# Patient Record
Sex: Female | Born: 1965
Health system: Southern US, Community
[De-identification: ages and names within clinical notes are randomized; demographics above are authoritative.]

## PROBLEM LIST (undated history)

## (undated) DIAGNOSIS — E119 Type 2 diabetes mellitus without complications: Secondary | ICD-10-CM

## (undated) DIAGNOSIS — F1010 Alcohol abuse, uncomplicated: Secondary | ICD-10-CM

## (undated) DIAGNOSIS — Z1509 Genetic susceptibility to other malignant neoplasm: Secondary | ICD-10-CM

## (undated) DIAGNOSIS — C55 Malignant neoplasm of uterus, part unspecified: Secondary | ICD-10-CM

## (undated) DIAGNOSIS — K8020 Calculus of gallbladder without cholecystitis without obstruction: Secondary | ICD-10-CM

## (undated) DIAGNOSIS — F419 Anxiety disorder, unspecified: Secondary | ICD-10-CM

## (undated) HISTORY — DX: Malignant neoplasm of uterus, part unspecified: C55

## (undated) HISTORY — PX: OTHER SURGICAL HISTORY: SHX169

## (undated) HISTORY — DX: Genetic susceptibility to other malignant neoplasm: Z15.09

## (undated) HISTORY — DX: Anxiety disorder, unspecified: F41.9

---

## 1994-10-29 HISTORY — PX: ABDOMINAL EXPLORATION SURGERY: SHX538

## 2011-10-14 ENCOUNTER — Ambulatory Visit: Payer: Self-pay

## 2011-10-14 DIAGNOSIS — M542 Cervicalgia: Secondary | ICD-10-CM

## 2011-11-13 ENCOUNTER — Ambulatory Visit: Payer: Self-pay | Admitting: Internal Medicine

## 2011-11-13 ENCOUNTER — Encounter: Payer: Self-pay | Admitting: Internal Medicine

## 2011-11-13 ENCOUNTER — Ambulatory Visit (INDEPENDENT_AMBULATORY_CARE_PROVIDER_SITE_OTHER): Payer: Self-pay | Admitting: Internal Medicine

## 2011-11-13 DIAGNOSIS — R739 Hyperglycemia, unspecified: Secondary | ICD-10-CM

## 2011-11-13 DIAGNOSIS — R7309 Other abnormal glucose: Secondary | ICD-10-CM

## 2011-11-13 DIAGNOSIS — E119 Type 2 diabetes mellitus without complications: Secondary | ICD-10-CM | POA: Insufficient documentation

## 2011-11-13 MED ORDER — GLUCOSE BLOOD VI STRP: ORAL_STRIP | Status: DC

## 2011-11-13 MED ORDER — FREESTYLE LANCETS MISC: Status: DC

## 2011-11-13 NOTE — Assessment & Plan Note (Addendum)
Dx today w/ diabetes base on a non fasting CBG of 460 (this afternoon CBG is 338). Discussed the dx, treatment, risks of DM Plan: Needs a A1C, CMP, CBC, TSH, FLP, some of the labs  apparently done at gyn Start meds w/results Diet-exercise discussed , info provided  Glucometer provided, to check twice a day RTC 1 month Addendum, Labs from 11/12/2011:  CBC normal except for platelets of 100. Creatinine, LFTs normal. Blood sugar 463, total cholesterol 233, triglycerides 119, LDL 119. TSH normal. Hemoglobin A1c 11.6 Plan: Amaryl 2 mg daily, metformin 500 mg 3 times a day, nutrition referral, return to the office in one month as planned. Will discuss low blood sugar symptoms Check blood sugars twice a day, readings in 2 weeks, call if less than 100 or more than 250. Recheck a CBC on return to the office

## 2011-11-13 NOTE — Progress Notes (Signed)
  Subjective:    Patient ID: Olivia Sweeney, female    DOB: [birth year], 46 y.o.   MRN: [id number]  HPI New patient, seen acutely b/c CGB was 460 at her gynecologist, Dr Dareen Piano. Pt feels well in general, has occasional dizziness x long time, better after she eats. She recalls years ago that she had hyperglycemia but a GTT was negative, has not seen a MD in years until this morning when she saw her gynecologist  Past medical history H/o obesity, up to 230 pounds x several years until ~ 2000 AODM dx 10-2011   Past surgical history No major surgeries  Social history divorced, no children Tobacco--no ETOH-- socially  Occupation-- works at [employer] Diet-- quite unhealthy per pt  Exercise-- active at work, no routine exercise    Family history Diabetes-- GM CAD-- no Stroke--no Colon cancer--no Breast cancer-- M Skin cancer-- F Uterine ca-- M Kidney ca-- M   Review of Systems No N-V, no abd pain No dysuria or grosss hematuria No CP-SOB No increase thirst or urination WT stable over the last few years although she was quite obese in the 2000s and lost wt by stop eating "junk"    Objective:   Physical Exam  Constitutional: She is oriented to person, place, and time. She appears well-developed and well-nourished. No distress.  HENT:  Head: Normocephalic and atraumatic.  Neck: No thyromegaly present.  Cardiovascular: Normal rate, regular rhythm and normal heart sounds.   No murmur heard. Pulmonary/Chest: Effort normal and breath sounds normal. No respiratory distress. She has no wheezes. She has no rales.  Abdominal: Soft. Bowel sounds are normal. She exhibits no distension. There is no rebound and no guarding.  Musculoskeletal: She exhibits no edema.  Neurological: She is alert and oriented to person, place, and time.  Skin: She is not diaphoretic.       Rosacea changes in the face   Psychiatric: She has a normal mood and affect. Her behavior is normal. Judgment  and thought content normal.       Assessment & Plan:

## 2011-11-14 ENCOUNTER — Telehealth: Payer: Self-pay | Admitting: Internal Medicine

## 2011-11-14 MED ORDER — METFORMIN HCL 500 MG PO TABS: 500.0000 mg | ORAL_TABLET | Freq: Three times a day (TID) | ORAL | Status: DC

## 2011-11-14 MED ORDER — GLIMEPIRIDE 2 MG PO TABS: 2.0000 mg | ORAL_TABLET | Freq: Every day | ORAL | Status: DC

## 2011-11-14 NOTE — Telephone Encounter (Signed)
Pt is aware and rx have been sent.  Called pharmacy and all pt's rx have been received.  Called to make pt aware.

## 2011-11-14 NOTE — Telephone Encounter (Signed)
Advised pt on low blood sugar signs and symptoms as well.

## 2011-11-14 NOTE — Telephone Encounter (Signed)
Advised patient, I reviewed the labs from her gynecologist, diabetes is confirmed. Plan: nutritionisrt referral (entered) Glimepiride 2 mg daily metformin 500 mg take twice a day with meals for one week, then take it 3 times a day. Side effects may include nausea and diarrhea Please discuss w/ pt low blood sugar symptoms Check blood sugars twice a day, call with readings in 2 weeks call if CBGs  less than 100 or more than 250. F/U 1 month as planned

## 2011-11-14 NOTE — Telephone Encounter (Signed)
Left a message for pt to return call 

## 2011-11-18 ENCOUNTER — Encounter: Payer: Self-pay | Admitting: Internal Medicine

## 2011-11-20 ENCOUNTER — Other Ambulatory Visit: Payer: Self-pay | Admitting: Obstetrics and Gynecology

## 2011-11-20 ENCOUNTER — Telehealth: Payer: Self-pay | Admitting: Internal Medicine

## 2011-11-20 DIAGNOSIS — R928 Other abnormal and inconclusive findings on diagnostic imaging of breast: Secondary | ICD-10-CM

## 2011-11-20 NOTE — Telephone Encounter (Signed)
That's quite unusual, if she's not taking any medications I would suspect her blood sugars to be still high. Ask patient to come back tomorrow with her glucometer, check her sugar with her glucometer and our glucometer. Hold medications for now.

## 2011-11-20 NOTE — Telephone Encounter (Signed)
Patient states that she was seen last week because her sugar level was high 476?Marland Kitchen Patient states that she checked her sugar this morning and it was 76. Patient states that she has yet to start taking the medication that was prescribed to her last week. She wants to know if she should start taking medicine since her sugar levels have dropped? Patient is unsure of the medication name that was given to her.

## 2011-11-20 NOTE — Telephone Encounter (Signed)
Discuss with patient will come in tomorrow with glucometer.

## 2011-11-21 ENCOUNTER — Ambulatory Visit (INDEPENDENT_AMBULATORY_CARE_PROVIDER_SITE_OTHER): Payer: Self-pay | Admitting: Internal Medicine

## 2011-11-21 DIAGNOSIS — E119 Type 2 diabetes mellitus without complications: Secondary | ICD-10-CM

## 2011-11-21 NOTE — Patient Instructions (Addendum)
Return to office in 1 month to see Dr Drue Novel  No medication needed at this time  Check blood sugars twice a day( fasting, 2 hours after meal rotate times) if reading >200 call office immediately. Call in 2 weeks with blood sugar readings.

## 2011-11-22 NOTE — Assessment & Plan Note (Signed)
Patient was asked to come here to check her glucometer after she reported CBG of 76 at home without taking any medications. Her blood sugar today is ~130, similar results with her glucometer. With this reading, we don't need to start medications. Unclear why her blood sugars was recently in the 400s and her A1c was elevated; labs error?. See Instructions

## 2011-11-22 NOTE — Progress Notes (Signed)
  Subjective:    Patient ID: Olivia Sweeney, female    DOB: 24-Dec-1965, 46 y.o.   MRN: [id number]  HPI Here to compare her glucometer  Review of Systems     Objective:   Physical Exam        Assessment & Plan:

## 2011-11-27 ENCOUNTER — Other Ambulatory Visit: Payer: Self-pay | Admitting: Obstetrics and Gynecology

## 2011-11-27 ENCOUNTER — Ambulatory Visit
Admission: RE | Admit: 2011-11-27 | Discharge: 2011-11-27 | Disposition: A | Discharge status: Home / Self Care | Payer: Self-pay | Source: Ambulatory Visit | Attending: Obstetrics and Gynecology | Admitting: Obstetrics and Gynecology

## 2011-11-27 DIAGNOSIS — R928 Other abnormal and inconclusive findings on diagnostic imaging of breast: Secondary | ICD-10-CM

## 2011-11-28 ENCOUNTER — Ambulatory Visit: Payer: Self-pay | Admitting: Internal Medicine

## 2011-12-23 ENCOUNTER — Telehealth: Payer: Self-pay | Admitting: Internal Medicine

## 2011-12-23 NOTE — Telephone Encounter (Signed)
Due for a OV, ref blood sugars

## 2011-12-24 NOTE — Telephone Encounter (Signed)
LMOVM for pt to return call to set up OV.  

## 2011-12-31 ENCOUNTER — Encounter: Payer: 59 | Attending: Internal Medicine

## 2012-01-01 ENCOUNTER — Ambulatory Visit (INDEPENDENT_AMBULATORY_CARE_PROVIDER_SITE_OTHER): Payer: 59 | Admitting: Internal Medicine

## 2012-01-01 DIAGNOSIS — E119 Type 2 diabetes mellitus without complications: Secondary | ICD-10-CM

## 2012-01-01 DIAGNOSIS — M25519 Pain in unspecified shoulder: Secondary | ICD-10-CM

## 2012-01-01 LAB — BASIC METABOLIC PANEL WITH GFR
CO2: 23 mEq/L (ref 19–32)
Glucose, Bld: 131 mg/dL — ABNORMAL HIGH (ref 70–99)
Potassium: 4.1 mEq/L (ref 3.5–5.3)
Sodium: 138 mEq/L (ref 135–145)

## 2012-01-01 NOTE — Assessment & Plan Note (Addendum)
Here for followup on diabetes. See previous entries. Today she reports that in the last few months has gotten 3 steroid injections as well as prednisone by mouth; I am still somehow puzzled by the fact that the A1c is very high and her blood sugars are not that elevated. She is losing weight, she is slightly tachycardic Plan: A1c, TSH. Depending on results she may need to be referred to endocrinology.

## 2012-01-01 NOTE — Patient Instructions (Signed)
Will call with results

## 2012-01-01 NOTE — Progress Notes (Signed)
  Subjective:    Patient ID: Olivia Sweeney, female    DOB: October 04, 1966, 46 y.o.   MRN: [id number]  HPI F/U from previous visit. She had a very high A1c at gynecology, I diagnosed her with diabetes, she however started to check her CBGs and they were essentially okay so she's currently not taking any medication for diabetes. Today, she also reports left shoulder pain with radiation to the left arm. Has been seen elsewhere, has been prescribed a steroid shot and steroid tablets on 05-2011, December 2012 and finally  12/11/2013.   Past medical history  H/o obesity, up to 230 pounds x several years until ~ 2000  AODM dx 10-2011  Past surgical history  No major surgeries   Review of Systems Ambulatory blood sugars are around 114 in the morning, however after a day and steroid injection 12-12-11 her CBGs went up temporarily. I notice some weight loss, she denies any nausea, vomiting, diarrhea. No abdominal pain. She does not feel unusually thirsty.     Objective:   Physical Exam Alert, oriented x3. Underwent appearing but otherwise in no distress. Lungs clear to auscultation bilaterally Cardiovascular, neuro murmur, heart rate about 100. Extremities no edema       Assessment & Plan:

## 2012-01-02 ENCOUNTER — Encounter: Payer: Self-pay | Admitting: Internal Medicine

## 2012-01-02 NOTE — Assessment & Plan Note (Signed)
See HPI, refer to ortho

## 2012-01-03 ENCOUNTER — Other Ambulatory Visit: Payer: Self-pay | Admitting: *Deleted

## 2012-01-03 MED ORDER — METFORMIN HCL 500 MG PO TABS: 500.0000 mg | ORAL_TABLET | Freq: Two times a day (BID) | ORAL | Status: DC

## 2012-02-19 ENCOUNTER — Encounter (HOSPITAL_COMMUNITY): Payer: Self-pay | Admitting: Pharmacy Technician

## 2012-02-19 ENCOUNTER — Other Ambulatory Visit: Payer: Self-pay | Admitting: Orthopedic Surgery

## 2012-02-28 ENCOUNTER — Encounter (HOSPITAL_COMMUNITY)
Admission: RE | Admit: 2012-02-28 | Discharge: 2012-02-28 | Disposition: A | Discharge status: Home / Self Care | Payer: 59 | Source: Ambulatory Visit | Attending: Orthopedic Surgery | Admitting: Orthopedic Surgery

## 2012-02-28 ENCOUNTER — Encounter (HOSPITAL_COMMUNITY): Payer: Self-pay

## 2012-02-28 LAB — COMPREHENSIVE METABOLIC PANEL
AST: 29 U/L (ref 0–37)
Albumin: 4 g/dL (ref 3.5–5.2)
Alkaline Phosphatase: 49 U/L (ref 39–117)
BUN: 16 mg/dL (ref 6–23)
Chloride: 100 mEq/L (ref 96–112)
Potassium: 3.7 mEq/L (ref 3.5–5.1)
Total Bilirubin: 0.3 mg/dL (ref 0.3–1.2)
Total Protein: 7.3 g/dL (ref 6.0–8.3)

## 2012-02-28 LAB — CBC
HCT: 39.4 % (ref 36.0–46.0)
MCHC: 34.5 g/dL (ref 30.0–36.0)
Platelets: 167 10*3/uL (ref 150–400)
RDW: 12.8 % (ref 11.5–15.5)
WBC: 3.6 10*3/uL — ABNORMAL LOW (ref 4.0–10.5)

## 2012-02-28 LAB — DIFFERENTIAL
Eosinophils Absolute: 0.1 10*3/uL (ref 0.0–0.7)
Lymphs Abs: 0.9 10*3/uL (ref 0.7–4.0)
Monocytes Absolute: 0.5 10*3/uL (ref 0.1–1.0)
Monocytes Relative: 15 % — ABNORMAL HIGH (ref 3–12)
Neutro Abs: 2 10*3/uL (ref 1.7–7.7)
Neutrophils Relative %: 55 % (ref 43–77)

## 2012-02-28 LAB — PROTIME-INR: INR: 0.88 (ref 0.00–1.49)

## 2012-02-28 LAB — TYPE AND SCREEN: ABO/RH(D): A POS

## 2012-02-28 LAB — SURGICAL PCR SCREEN
MRSA, PCR: NEGATIVE
Staphylococcus aureus: NEGATIVE

## 2012-02-28 LAB — HCG, SERUM, QUALITATIVE: Preg, Serum: NEGATIVE

## 2012-02-28 LAB — APTT: aPTT: 27 seconds (ref 24–37)

## 2012-02-28 NOTE — Pre-Procedure Instructions (Addendum)
20 NOHELANI BENNING  02/28/2012   Your procedure is scheduled on:  Thursday May 9  Report to [facility] at 9:00 AM.  Call this number if you have problems the morning of surgery: [phone]   Remember:   Do not eat food:After Midnight.  May have clear liquids: up to 4 Hours before arrival.  Clear liquids include soda, tea, black coffee, apple or grape juice, broth.  Take these medicines the morning of surgery with A SIP OF WATER: none   Do not wear jewelry, make-up or nail polish.  Do not wear lotions, powders, or perfumes. You may wear deodorant.  Do not shave 48 hours prior to surgery.  Do not bring valuables to the hospital.  Contacts, dentures or bridgework may not be worn into surgery.  Leave suitcase in the car. After surgery it may be brought to your room.  For patients admitted to the hospital, checkout time is 11:00 AM the day of discharge.   Patients discharged the day of surgery will not be allowed to drive home.  Name and phone number of your driver: NA  Special Instructions: Incentive Spirometry - Practice and bring it with you on the day of surgery. and CHG Shower Use Special Wash: 1/2 bottle night before surgery and 1/2 bottle morning of surgery.   Please read over the following fact sheets that you were given: Pain Booklet, Coughing and Deep Breathing, Blood Transfusion Information and Surgical Site Infection Prevention

## 2012-03-05 MED ORDER — CEFAZOLIN SODIUM-DEXTROSE 2-3 GM-% IV SOLR
2.0000 g | INTRAVENOUS | Status: AC
  Administered 2012-03-06: 2 g via INTRAVENOUS
  Filled 2012-03-05: qty 50

## 2012-03-06 ENCOUNTER — Encounter (HOSPITAL_COMMUNITY): Payer: Self-pay

## 2012-03-06 ENCOUNTER — Encounter (HOSPITAL_COMMUNITY): Payer: Self-pay | Admitting: Certified Registered Nurse Anesthetist

## 2012-03-06 ENCOUNTER — Encounter (HOSPITAL_COMMUNITY)
Admission: RE | Disposition: A | Discharge status: Home / Self Care | Payer: Self-pay | Source: Ambulatory Visit | Attending: Orthopedic Surgery

## 2012-03-06 ENCOUNTER — Ambulatory Visit (HOSPITAL_COMMUNITY): Payer: 59

## 2012-03-06 ENCOUNTER — Ambulatory Visit (HOSPITAL_COMMUNITY): Payer: 59 | Admitting: Certified Registered Nurse Anesthetist

## 2012-03-06 ENCOUNTER — Inpatient Hospital Stay (HOSPITAL_COMMUNITY)
Admission: RE | Admit: 2012-03-06 | Discharge: 2012-03-07 | DRG: 473 | Disposition: A | Discharge status: Home / Self Care | Payer: 59 | Source: Ambulatory Visit | Attending: Orthopedic Surgery | Admitting: Orthopedic Surgery

## 2012-03-06 DIAGNOSIS — Z01818 Encounter for other preprocedural examination: Secondary | ICD-10-CM

## 2012-03-06 DIAGNOSIS — Z01810 Encounter for preprocedural cardiovascular examination: Secondary | ICD-10-CM

## 2012-03-06 DIAGNOSIS — Z01812 Encounter for preprocedural laboratory examination: Secondary | ICD-10-CM

## 2012-03-06 DIAGNOSIS — M5030 Other cervical disc degeneration, unspecified cervical region: Principal | ICD-10-CM | POA: Diagnosis present

## 2012-03-06 DIAGNOSIS — M5020 Other cervical disc displacement, unspecified cervical region: Secondary | ICD-10-CM | POA: Diagnosis present

## 2012-03-06 DIAGNOSIS — E119 Type 2 diabetes mellitus without complications: Secondary | ICD-10-CM | POA: Diagnosis present

## 2012-03-06 DIAGNOSIS — M5412 Radiculopathy, cervical region: Secondary | ICD-10-CM

## 2012-03-06 DIAGNOSIS — Z01811 Encounter for preprocedural respiratory examination: Secondary | ICD-10-CM

## 2012-03-06 HISTORY — PX: ANTERIOR CERVICAL DECOMP/DISCECTOMY FUSION: SHX1161

## 2012-03-06 LAB — GLUCOSE, CAPILLARY: Glucose-Capillary: 104 mg/dL — ABNORMAL HIGH (ref 70–99)

## 2012-03-06 LAB — URINALYSIS, ROUTINE W REFLEX MICROSCOPIC
Glucose, UA: NEGATIVE mg/dL
Ketones, ur: NEGATIVE mg/dL
Protein, ur: 30 mg/dL — AB

## 2012-03-06 LAB — URINE MICROSCOPIC-ADD ON

## 2012-03-06 SURGERY — ANTERIOR CERVICAL DECOMPRESSION/DISCECTOMY FUSION 1 LEVEL
Anesthesia: General | Site: Spine Cervical | Laterality: Left | Wound class: Clean

## 2012-03-06 MED ORDER — HYDROMORPHONE HCL PF 1 MG/ML IJ SOLN
0.2500 mg | INTRAMUSCULAR | Status: DC | PRN
  Administered 2012-03-06: 0.5 mg via INTRAVENOUS

## 2012-03-06 MED ORDER — ACETAMINOPHEN 650 MG RE SUPP: 650.0000 mg | RECTAL | Status: DC | PRN

## 2012-03-06 MED ORDER — ONDANSETRON HCL 4 MG/2ML IJ SOLN: 4.0000 mg | INTRAMUSCULAR | Status: DC | PRN

## 2012-03-06 MED ORDER — DOCUSATE SODIUM 100 MG PO CAPS
100.0000 mg | ORAL_CAPSULE | Freq: Two times a day (BID) | ORAL | Status: DC
  Administered 2012-03-06: 100 mg via ORAL
  Filled 2012-03-06: qty 1

## 2012-03-06 MED ORDER — MENTHOL 3 MG MT LOZG
1.0000 | LOZENGE | OROMUCOSAL | Status: DC | PRN
  Administered 2012-03-06: 3 mg via ORAL
  Filled 2012-03-06: qty 9

## 2012-03-06 MED ORDER — BUPIVACAINE-EPINEPHRINE 0.25% -1:200000 IJ SOLN
INTRAMUSCULAR | Status: DC | PRN
  Administered 2012-03-06: 2 mL

## 2012-03-06 MED ORDER — SENNA 8.6 MG PO TABS
1.0000 | ORAL_TABLET | Freq: Two times a day (BID) | ORAL | Status: DC
  Filled 2012-03-06 (×3): qty 1

## 2012-03-06 MED ORDER — ROCURONIUM BROMIDE 100 MG/10ML IV SOLN
INTRAVENOUS | Status: DC | PRN
  Administered 2012-03-06: 50 mg via INTRAVENOUS

## 2012-03-06 MED ORDER — SODIUM CHLORIDE 0.9 % IJ SOLN: 3.0000 mL | INTRAMUSCULAR | Status: DC | PRN

## 2012-03-06 MED ORDER — ONDANSETRON HCL 4 MG/2ML IJ SOLN: 4.0000 mg | Freq: Once | INTRAMUSCULAR | Status: DC | PRN

## 2012-03-06 MED ORDER — DIAZEPAM 5 MG PO TABS
5.0000 mg | ORAL_TABLET | Freq: Four times a day (QID) | ORAL | Status: DC | PRN
  Administered 2012-03-06: 5 mg via ORAL
  Filled 2012-03-06: qty 1

## 2012-03-06 MED ORDER — ALUM & MAG HYDROXIDE-SIMETH 200-200-20 MG/5ML PO SUSP: 30.0000 mL | Freq: Four times a day (QID) | ORAL | Status: DC | PRN

## 2012-03-06 MED ORDER — DEXAMETHASONE SODIUM PHOSPHATE 4 MG/ML IJ SOLN
INTRAMUSCULAR | Status: DC | PRN
  Administered 2012-03-06: 4 mg via INTRAVENOUS

## 2012-03-06 MED ORDER — MORPHINE SULFATE 4 MG/ML IJ SOLN: 0.0500 mg/kg | INTRAMUSCULAR | Status: DC | PRN

## 2012-03-06 MED ORDER — POVIDONE-IODINE 7.5 % EX SOLN
Freq: Once | CUTANEOUS | Status: DC
  Filled 2012-03-06: qty 118

## 2012-03-06 MED ORDER — CEFAZOLIN SODIUM 1-5 GM-% IV SOLN
1.0000 g | Freq: Three times a day (TID) | INTRAVENOUS | Status: AC
  Administered 2012-03-06 – 2012-03-07 (×2): 1 g via INTRAVENOUS
  Filled 2012-03-06 (×2): qty 50

## 2012-03-06 MED ORDER — ZOLPIDEM TARTRATE 5 MG PO TABS: 5.0000 mg | ORAL_TABLET | Freq: Every evening | ORAL | Status: DC | PRN

## 2012-03-06 MED ORDER — LACTATED RINGERS IV SOLN
INTRAVENOUS | Status: DC | PRN
  Administered 2012-03-06 (×2): via INTRAVENOUS

## 2012-03-06 MED ORDER — FENTANYL CITRATE 0.05 MG/ML IJ SOLN
INTRAMUSCULAR | Status: DC | PRN
  Administered 2012-03-06: 25 ug via INTRAVENOUS
  Administered 2012-03-06: 50 ug via INTRAVENOUS
  Administered 2012-03-06: 150 ug via INTRAVENOUS
  Administered 2012-03-06: 25 ug via INTRAVENOUS

## 2012-03-06 MED ORDER — LACTATED RINGERS IV SOLN
INTRAVENOUS | Status: DC
  Administered 2012-03-06: 11:00:00 via INTRAVENOUS

## 2012-03-06 MED ORDER — PHENYLEPHRINE HCL 10 MG/ML IJ SOLN
INTRAMUSCULAR | Status: DC | PRN
  Administered 2012-03-06: 80 ug via INTRAVENOUS
  Administered 2012-03-06: 100 ug via INTRAVENOUS
  Administered 2012-03-06: 80 ug via INTRAVENOUS
  Administered 2012-03-06: 40 ug via INTRAVENOUS
  Administered 2012-03-06: 80 ug via INTRAVENOUS
  Administered 2012-03-06: 40 ug via INTRAVENOUS
  Administered 2012-03-06: 50 ug via INTRAVENOUS

## 2012-03-06 MED ORDER — THROMBIN 20000 UNITS EX KIT
PACK | OROMUCOSAL | Status: DC | PRN
  Administered 2012-03-06: 13:00:00 via TOPICAL

## 2012-03-06 MED ORDER — ONDANSETRON HCL 4 MG/2ML IJ SOLN
INTRAMUSCULAR | Status: DC | PRN
  Administered 2012-03-06: 4 mg via INTRAVENOUS

## 2012-03-06 MED ORDER — POTASSIUM CHLORIDE IN NACL 20-0.9 MEQ/L-% IV SOLN
INTRAVENOUS | Status: DC
  Administered 2012-03-06: 17:00:00 via INTRAVENOUS
  Filled 2012-03-06 (×3): qty 1000

## 2012-03-06 MED ORDER — MIDAZOLAM HCL 5 MG/5ML IJ SOLN
INTRAMUSCULAR | Status: DC | PRN
  Administered 2012-03-06: 2 mg via INTRAVENOUS

## 2012-03-06 MED ORDER — MORPHINE SULFATE 2 MG/ML IJ SOLN: 2.0000 mg | INTRAMUSCULAR | Status: DC | PRN

## 2012-03-06 MED ORDER — OXYCODONE-ACETAMINOPHEN 5-325 MG PO TABS
1.0000 | ORAL_TABLET | ORAL | Status: DC | PRN
  Administered 2012-03-06: 1 via ORAL
  Filled 2012-03-06: qty 1

## 2012-03-06 MED ORDER — PHENOL 1.4 % MT LIQD: 1.0000 | OROMUCOSAL | Status: DC | PRN

## 2012-03-06 MED ORDER — LIDOCAINE HCL (CARDIAC) 20 MG/ML IV SOLN
INTRAVENOUS | Status: DC | PRN
  Administered 2012-03-06: 100 mg via INTRAVENOUS

## 2012-03-06 MED ORDER — ACETAMINOPHEN 325 MG PO TABS: 650.0000 mg | ORAL_TABLET | ORAL | Status: DC | PRN

## 2012-03-06 MED ORDER — SODIUM CHLORIDE 0.9 % IJ SOLN
3.0000 mL | Freq: Two times a day (BID) | INTRAMUSCULAR | Status: DC
  Administered 2012-03-06: 3 mL via INTRAVENOUS

## 2012-03-06 MED ORDER — PROPOFOL 10 MG/ML IV EMUL
INTRAVENOUS | Status: DC | PRN
  Administered 2012-03-06: 130 mg via INTRAVENOUS

## 2012-03-06 SURGICAL SUPPLY — 73 items
BENZOIN TINCTURE PRP APPL 2/3 (GAUZE/BANDAGES/DRESSINGS) ×2 IMPLANT
BIT DRILL NEURO 2X3.1 SFT TUCH (MISCELLANEOUS) ×1 IMPLANT
BLADE SURG 15 STRL LF DISP TIS (BLADE) ×1 IMPLANT
BLADE SURG 15 STRL SS (BLADE) ×1
BLADE SURG ROTATE 9660 (MISCELLANEOUS) ×2 IMPLANT
BUR MATCHSTICK NEURO 3.0 LAGG (BURR) ×2 IMPLANT
CARTRIDGE OIL MAESTRO DRILL (MISCELLANEOUS) ×1 IMPLANT
CERVICAL PARALLEL MED 7MM (Bone Implant) ×2 IMPLANT
CLOSURE STERI-STRIP 1/4X4 (GAUZE/BANDAGES/DRESSINGS) ×2 IMPLANT
CLOTH BEACON ORANGE TIMEOUT ST (SAFETY) ×2 IMPLANT
COLLAR CERV LO CONTOUR FIRM DE (SOFTGOODS) IMPLANT
CORDS BIPOLAR (ELECTRODE) ×2 IMPLANT
COVER SURGICAL LIGHT HANDLE (MISCELLANEOUS) ×2 IMPLANT
CRADLE DONUT ADULT HEAD (MISCELLANEOUS) ×2 IMPLANT
DEVICE ENDSKLTN IMPLNT MED 6MM (Orthopedic Implant) ×1 IMPLANT
DIFFUSER DRILL AIR PNEUMATIC (MISCELLANEOUS) ×2 IMPLANT
DISTRACTION PIN ×2 IMPLANT
DRAIN JACKSON RD 7FR 3/32 (WOUND CARE) IMPLANT
DRAPE C-ARM 42X72 X-RAY (DRAPES) ×2 IMPLANT
DRAPE POUCH INSTRU U-SHP 10X18 (DRAPES) ×2 IMPLANT
DRAPE SURG 17X23 STRL (DRAPES) ×6 IMPLANT
DRILL NEURO 2X3.1 SOFT TOUCH (MISCELLANEOUS) ×2
DURAPREP 26ML APPLICATOR (WOUND CARE) ×2 IMPLANT
ELECT COATED BLADE 2.86 ST (ELECTRODE) ×2 IMPLANT
ELECT REM PT RETURN 9FT ADLT (ELECTROSURGICAL) ×2
ELECTRODE REM PT RTRN 9FT ADLT (ELECTROSURGICAL) ×1 IMPLANT
ENDOSKELETON IMPLANT MED 6MM (Orthopedic Implant) ×2 IMPLANT
EVACUATOR SILICONE 100CC (DRAIN) IMPLANT
GAUZE SPONGE 4X4 16PLY XRAY LF (GAUZE/BANDAGES/DRESSINGS) ×2 IMPLANT
GLOVE BIO SURGEON STRL SZ8 (GLOVE) ×2 IMPLANT
GLOVE BIOGEL PI IND STRL 8 (GLOVE) ×2 IMPLANT
GLOVE BIOGEL PI INDICATOR 8 (GLOVE) ×2
GLOVE BIOGEL PI ORTHO PRO SZ8 (GLOVE) ×1
GLOVE PI ORTHO PRO STRL SZ8 (GLOVE) ×1 IMPLANT
GOWN BRE IMP PREV XXLGXLNG (GOWN DISPOSABLE) ×2 IMPLANT
GOWN SRG XL XLNG 56XLVL 4 (GOWN DISPOSABLE) ×1 IMPLANT
GOWN STRL NON-REIN LRG LVL3 (GOWN DISPOSABLE) ×2 IMPLANT
GOWN STRL NON-REIN XL XLG LVL4 (GOWN DISPOSABLE) ×1
IV CATH 14GX2 1/4 (CATHETERS) ×2 IMPLANT
KIT BASIN OR (CUSTOM PROCEDURE TRAY) ×2 IMPLANT
KIT ROOM TURNOVER OR (KITS) ×2 IMPLANT
MANIFOLD NEPTUNE II (INSTRUMENTS) ×2 IMPLANT
NEEDLE 27GAX1X1/2 (NEEDLE) ×2 IMPLANT
NEEDLE SPNL 20GX3.5 QUINCKE YW (NEEDLE) ×2 IMPLANT
NS IRRIG 1000ML POUR BTL (IV SOLUTION) ×2 IMPLANT
OIL CARTRIDGE MAESTRO DRILL (MISCELLANEOUS) ×2
PACK ORTHO CERVICAL (CUSTOM PROCEDURE TRAY) ×2 IMPLANT
PAD ARMBOARD 7.5X6 YLW CONV (MISCELLANEOUS) ×4 IMPLANT
PATTIES SURGICAL .5 X.5 (GAUZE/BANDAGES/DRESSINGS) IMPLANT
PATTIES SURGICAL .5 X1 (DISPOSABLE) IMPLANT
PLATE VECTRA (Plate) ×2 IMPLANT
PUTTY BONE DBX 2.5 MIS (Bone Implant) ×2 IMPLANT
SCREW 4.0X16MM (Screw) ×12 IMPLANT
SPONGE GAUZE 4X4 12PLY (GAUZE/BANDAGES/DRESSINGS) ×2 IMPLANT
SPONGE INTESTINAL PEANUT (DISPOSABLE) ×2 IMPLANT
SPONGE SURGIFOAM ABS GEL 100 (HEMOSTASIS) IMPLANT
STRIP CLOSURE SKIN 1/2X4 (GAUZE/BANDAGES/DRESSINGS) ×2 IMPLANT
SURGIFLO TRUKIT (HEMOSTASIS) IMPLANT
SURGIFLO W/THROMBIN 8M KIT (HEMOSTASIS) ×2 IMPLANT
SUT MNCRL AB 4-0 PS2 18 (SUTURE) IMPLANT
SUT SILK 4 0 (SUTURE)
SUT SILK 4-0 18XBRD TIE 12 (SUTURE) IMPLANT
SUT VIC AB 1 CT1 27 (SUTURE) ×1
SUT VIC AB 1 CT1 27XBRD ANBCTR (SUTURE) ×1 IMPLANT
SUT VIC AB 2-0 CT2 18 VCP726D (SUTURE) ×2 IMPLANT
SYR BULB IRRIGATION 50ML (SYRINGE) ×2 IMPLANT
SYR CONTROL 10ML LL (SYRINGE) ×4 IMPLANT
TAPE CLOTH 4X10 WHT NS (GAUZE/BANDAGES/DRESSINGS) ×2 IMPLANT
TAPE UMBILICAL COTTON 1/8X30 (MISCELLANEOUS) ×2 IMPLANT
TOWEL OR 17X24 6PK STRL BLUE (TOWEL DISPOSABLE) ×2 IMPLANT
TOWEL OR 17X26 10 PK STRL BLUE (TOWEL DISPOSABLE) ×2 IMPLANT
WATER STERILE IRR 1000ML POUR (IV SOLUTION) ×2 IMPLANT
YANKAUER SUCT BULB TIP NO VENT (SUCTIONS) ×2 IMPLANT

## 2012-03-06 NOTE — Progress Notes (Signed)
Orthopedic Tech Progress Note Patient Details:  Olivia Sweeney 01-30-1966 [id number]  Other Ortho Devices Type of Ortho Device: [place] cervical collar Ortho Device Interventions: Application   Cammer, Mickie Bail 03/06/2012, 4:05 PM

## 2012-03-06 NOTE — H&P (Signed)
PREOPERATIVE H&P  Chief Complaint: left arm pain   HPI: Olivia Sweeney is a 46 y.o. female who presents with left arm pain  Past Medical History  Diagnosis Date  . Diabetes mellitus     not taking meds, monitoring daily and levels are usually in low 100s,  managing with diet   Past Surgical History  Procedure Date  . Abdominal exploration surgery     looking for fibroids   History   Social History  . Marital Status: Single    Spouse Name: N/A    Number of Children: N/A  . Years of Education: N/A   Social History Main Topics  . Smoking status: Never Smoker   . Smokeless tobacco: Not on file  . Alcohol Use: Yes     2 drinks per night  . Drug Use: No  . Sexually Active: Not on file   Other Topics Concern  . Not on file   Social History Narrative  . No narrative on file   No family history on file. Allergies  Allergen Reactions  . Bee Venom    Prior to Admission medications   Not on File     All other systems have been reviewed and were otherwise negative with the exception of those mentioned in the HPI and as above.  Physical Exam: There were no vitals filed for this visit.  General: Alert, no acute distress Cardiovascular: No pedal edema Respiratory: No cyanosis, no use of accessory musculature GI: No organomegaly, abdomen is soft and non-tender Skin: No lesions in the area of chief complaint Neurologic: Sensation intact distally Psychiatric: Patient is competent for consent with normal mood and affect Lymphatic: No axillary or cervical lymphadenopathy  MUSCULOSKELETAL: + TTP posterior neck  Assessment/Plan: Left arm pain Plan for Procedure(s): ANTERIOR CERVICAL DECOMPRESSION/DISCECTOMY FUSION 2 levels   Emilee Hero, MD 03/06/2012 6:41 AM

## 2012-03-06 NOTE — Transfer of Care (Signed)
Immediate Anesthesia Transfer of Care Note  Patient: Olivia Sweeney  Procedure(s) Performed: Procedure(s) (LRB): ANTERIOR CERVICAL DECOMPRESSION/DISCECTOMY FUSION 1 LEVEL (Left)  Patient Location: PACU  Anesthesia Type: General  Level of Consciousness: awake, alert , oriented and patient cooperative  Airway & Oxygen Therapy: Patient Spontanous Breathing and Patient connected to face mask oxygen  Post-op Assessment: Report given to PACU RN, Post -op Vital signs reviewed and stable and Patient moving all extremities X 4  Post vital signs: Reviewed and stable  Complications: No apparent anesthesia complications

## 2012-03-06 NOTE — Anesthesia Preprocedure Evaluation (Addendum)
Anesthesia Evaluation  Patient identified by MRN, date of birth, ID band Patient awake    Reviewed: Allergy & Precautions, H&P , NPO status , Patient's Chart, lab work & pertinent test results  History of Anesthesia Complications Negative for: history of anesthetic complications  Airway Mallampati: I TM Distance: >3 FB Neck ROM: Full    Dental  (+) Teeth Intact and Dental Advisory Given,    Pulmonary neg pulmonary ROS,          Cardiovascular Exercise Tolerance: Good negative cardio ROS      Neuro/Psych L hand tingling and numbness     GI/Hepatic negative GI ROS, Neg liver ROS,   Endo/Other  Diabetes mellitus- (controlled with diet), Well Controlled, Type 2  Renal/GU negative Renal ROS  negative genitourinary   Musculoskeletal  (+) Arthritis - (neck), Osteoarthritis,    Abdominal   Peds negative pediatric ROS (+)  Hematology negative hematology ROS (+)   Anesthesia Other Findings   Reproductive/Obstetrics negative OB ROS                          Anesthesia Physical Anesthesia Plan  ASA: II  Anesthesia Plan: General   Post-op Pain Management:    Induction: Intravenous  Airway Management Planned: Oral ETT  Additional Equipment:   Intra-op Plan:   Post-operative Plan: Extubation in OR  Informed Consent: I have reviewed the patients History and Physical, chart, labs and discussed the procedure including the risks, benefits and alternatives for the proposed anesthesia with the patient or authorized representative who has indicated his/her understanding and acceptance.     Plan Discussed with: CRNA and Surgeon  Anesthesia Plan Comments:         Anesthesia Quick Evaluation

## 2012-03-06 NOTE — Progress Notes (Signed)
Call to Dr. Michelle Piper, reported pt.'s BPs taken today. No new orders rec'd.

## 2012-03-06 NOTE — Anesthesia Postprocedure Evaluation (Signed)
  Anesthesia Post-op Note  Patient: Olivia Sweeney  Procedure(s) Performed: Procedure(s) (LRB): ANTERIOR CERVICAL DECOMPRESSION/DISCECTOMY FUSION 1 LEVEL (Left)  Patient Location: PACU  Anesthesia Type: General  Level of Consciousness: awake, alert  and oriented  Airway and Oxygen Therapy: Patient Spontanous Breathing and Patient connected to nasal cannula oxygen  Post-op Pain: none  Post-op Assessment: Post-op Vital signs reviewed, Patient's Cardiovascular Status Stable, Respiratory Function Stable, Patent Airway, No signs of Nausea or vomiting, Adequate PO intake, Pain level controlled and No headache  Post-op Vital Signs: Reviewed and stable  Complications: No apparent anesthesia complications

## 2012-03-06 NOTE — Preoperative (Signed)
Beta Blockers   Reason not to administer Beta Blockers:Not Applicable 

## 2012-03-07 LAB — GLUCOSE, CAPILLARY: Glucose-Capillary: 126 mg/dL — ABNORMAL HIGH (ref 70–99)

## 2012-03-07 NOTE — Care Management Note (Signed)
    Page 1 of 1   03/07/2012     4:25:43 PM   CARE MANAGEMENT NOTE 03/07/2012  Patient:  Olivia Sweeney, Olivia Sweeney   Account Number:  [id number]  Date Initiated:  03/07/2012  Documentation initiated by:  Remington Highbaugh  Subjective/Objective Assessment:   POD#1 s/p 1 level ACDF  ambulatory independently  no HH services ordered, no DME ordered     Action/Plan:   patient to be d/cd home, no HH services or DME needed.   Anticipated DC Date:  03/07/2012   Anticipated DC Plan:           Choice offered to / List presented to:             Status of service:  Completed, signed off Medicare Important Message given?  NO (If response is "NO", the following Medicare IM given date fields will be blank) Date Medicare IM given:   Date Additional Medicare IM given:    Discharge Disposition:    Per UR Regulation:  Reviewed for med. necessity/level of care/duration of stay  If discussed at Long Length of Stay Meetings, dates discussed:    Comments:

## 2012-03-07 NOTE — Progress Notes (Signed)
Patient reports improved left arm pain, minimal neck discomfort.   BP 135/85  Pulse 83  Temp(Src) 98.2 F (36.8 C) (Oral)  Resp 14  SpO2 97%  LMP 02/11/2012 Collar well applied  NVI   POD #1 after C5-7 acdf  - d/c home today, follow up 2 weeks

## 2012-03-07 NOTE — Op Note (Signed)
NAMEMARIALIZ, FERREBEE             ACCOUNT NO.:  [id number]  MEDICAL RECORD NO.:  [id number]  LOCATION:  3526                         FACILITY:  [facility]  PHYSICIAN:  Estill Bamberg, MD      DATE OF BIRTH:  Jul 08, 1966  DATE OF PROCEDURE:  03/06/2012 DATE OF DISCHARGE:                              OPERATIVE REPORT   PREOPERATIVE DIAGNOSES: 1. Left-sided cervical radiculopathy. 2. Severe profound degenerative disk disease at C5-6 greater than C6-     7.  POSTOPERATIVE DIAGNOSES: 1. Left-sided cervical radiculopathy. 2. Severe profound degenerative disk disease at C5-6 greater than C6-     7.  PROCEDURES: 1. C5-6, C6-7 anterior cervical decompression and fusion. 2. Placement of anterior instrumentation, C5, C6, C7. 3. Placement of interbody device x2 (Titan interbody cage). 4. Use of local autograft. 5. Use of morselized allograft. 6. Intraoperative use of fluoroscopy.  SURGEON:  Estill Bamberg, MD  ASSISTANT:  Janace Litten, OPA  ANESTHESIA:  General endotracheal anesthesia.  COMPLICATIONS:  None.  DISPOSITION:  Stable.  ESTIMATED BLOOD LOSS:  Minimal.  INDICATIONS FOR PROCEDURE:  Briefly, Ms. Yerian is a very pleasant 46- year-old female, who I evaluated in my office on February 15, 2012.  At that point, she presented with an 59-month history of severe pain in her left arm.  She was managed conservatively by Dr. Margaretha Sheffield and an MRI was ultimately obtained and notable for a very large disk herniation at the C6-7 level.  There was no extruded disk fragment, which was clearly causing compression of both the spinal cord and the exiting C7 nerve. In addition, the patient was noted to have severe and profound degenerative disk disease associated with the C5-6 level.  Despite her ongoing and appropriate conservative care, she did continuing to have both severe pain and weakness in the left arm.  We therefore did have a discussion regarding going forward with a C5-6 and C6-7  anterior cervical decompression and fusion.  The patient fully understood the risks and limitations of the procedure as outlined in my preoperative note.  OPERATIVE DETAILS:  On Mar 06, 2012, the patient was brought to Surgery and general endotracheal anesthesia was administered.  The patient was placed supine on a well-padded hospital bed.  The arms were secured to the patient's sides.  All bony prominences were meticulously padded. The neck was placed in a gentle degree of extension.  SCDs were placed and antibiotics were given.  Time-out procedure was performed.  The neck was then prepped and draped in usual sterile fashion.  I then made a transverse incision from the midline to the medial border of the sternocleidomastoid muscle.  The platysma was sharply incised.  The plane between the sternocleidomastoid muscle laterally and strap muscles medially was readily identified and explored, and the anterior cervical spine was readily noted.  The C5, C6 and C7 vertebral bodies were surprisely exposed using electrocautery.  Of note, I did obtain a lateral intraoperative fluoroscopic view to confirm the appropriate operative levels.  Of note, there was a very large and very prominent osteophyte noted at the C5-6 level.  This was taken down using a large rongeur and the bone was placed in the back table  for later use.  I then turned my attention towards the C5-6 interspace.  A self-retaining Shadow-Line retractor was placed, centered over the C6-7 interspace.  I then used a 15-blade knife to perform an annulotomy anteriorly.  I then went forward with a standard diskectomy using a series of curettes, Kerrison punches, and pituitary rongeurs.  I then placed Caspar pins in the C6 and C7 vertebral bodies and gentle distraction was applied across the interspace.  I then encountered the posterior longitudinal ligament. The posterior longitudinal ligament was entered using a micro nerve hook.  Off  to the right side, the area away from the spinal cord and nerve compression, I did take down the posterior longitudinal ligament. I then worked my way towards the left side.  I used a #1 Kerrison to carefully make my way towards the left neural foramen.  I sequentially used a nerve hook to evaluate the decompression of the spinal cord in the exiting C7 nerve.  It was readily noted that there were three large and prominent disk fragments in the epidural space extending into the neural foramen.  These were teased away using a nerve hook and ultimately removed using a pituitary rongeur in addition to a Kerrison. There were a total of four large-sized disk fragments causing obvious compression of both the spinal cord and the exiting C7 nerve and they were removed uneventfully.  At the termination of this portion of the procedure, I was able to easily pass a nerve hook out the neural foramen on the left side, which did confirm appropriate decompression of both the spinal cord and the neural foramen on the left side.  At this point, I turned my attention towards the C6-7 disk space.  I did use a high- speed bur in addition to a series of rasp to prepare the disk space in anticipation for a fusion.  I then placed a series of trials and I did ultimately feel that a parallel 7-mm medium trial would be the most appropriate fit.  This was selected and packed with allograft in the form of the DBX mix in addition to autograft obtained for removing the osteophytes anteriorly.  This was tamped into position uneventfully and I did note an excellent press-fit.  I did use both AP and lateral fluoroscopy to confirm appropriate positioning of the implant.  I then removed the Caspar pin from the C7 vertebral body and bone wax was placed in the place of the hole.  I then turned my attention towards the C5-6 interspace.  I again performed a diskectomy in the manner as described previously.  Of note, at this  particular interspace, there was significant collapse and it was very difficult to gain access into the intervertebral space initially.  I did, however, place a Caspar pin into the C5 vertebral body and distraction was applied, and this did help gain access into the intervertebral space.  Again, the diskectomy was performed in the manner as described previously to the local level of the posterior longitudinal ligament.  The contour of the disk space was somewhat irregular and I did spend a significant amount of time in order to try to even out the endplates both above and below in order to most appropriately except the interbody implant.  I then prepared the endplates and placed the series of trials, and I did feel that a 6-mm lordotic trial would be the most appropriate fit.  Again, a 6-mm lordotic medium implant was packed with autograft and allograft as  mentioned previously and tamped into position.  Again, I used lateral fluoroscopy to confirm appropriate positioning of the implant.  I then continued to remove osteophytes anteriorly in anticipation for the anterior hardware.  I then chose an appropriate-sized Vectra plate, which was placed over the anterior cervical spine.  I did place a total of two self-drilling, self-tapping variable angle screws in each vertebral body for a total of 16-mm screws.  Again, lateral fluoroscopy was utilized and I was extremely pleased with the final appearance of the construct.  The wound at this point was copiously irrigated and I did explore the wound for any undue bleeding.  There was none.  I therefore went forward with closure.  The platysma was closed using 2-0 Vicryl.  The skin was closed using 4-0 Monocryl.  Benzoin and Steri-Strips were applied followed by sterile dressing.  All instrument counts were correct at the termination of the procedure.  Of note, Janace Litten, was my assistant throughout the procedure and aided in essential  retraction and suctioning required throughout the surgery.     Estill Bamberg, MD     MD/MEDQ  D:  03/06/2012  T:  03/07/2012  Job:  [id number]  cc:   Dr. Alyson Reedy, MD

## 2012-03-07 NOTE — Discharge Summary (Signed)
NAMELUZMARIA, DEVAUX             ACCOUNT NO.:  [id number]  MEDICAL RECORD NO.:  [id number]  LOCATION:  3526                         FACILITY:  [facility]  PHYSICIAN:  Estill Bamberg, MD      DATE OF BIRTH:  Feb 01, 1966  DATE OF ADMISSION:  03/06/2012 DATE OF DISCHARGE:  03/07/2012                              DISCHARGE SUMMARY   ADMISSION DIAGNOSES: 1. C5-6 and C6-7 degenerative disk disease. 2. Left-sided cervical radiculopathy.  DISCHARGE DIAGNOSES: 1. C5-6 and C6-7 degenerative disk disease. 2. Left-sided cervical radiculopathy.  ADMITTING PHYSICIAN:  Estill Bamberg, MD.  INDICATIONS FOR ADMISSION:  Briefly, Ms. Decaprio is a very pleasant 46- year-old female, who presented to me with severe debilitating pain in her left arm for about the last 8 to 9 months.  An MRI was consistent with a very large and prominent extruded disk herniation at the C6-7 level, clearly resulting in left-sided C7 radiculopathy.  The patient also had severe and profound degenerative disk disease at the C5-6 level, and the patient was therefore admitted on Mar 06, 2012, for a 2- level ACDF.  HOSPITAL COURSE:  On Mar 06, 2012, the patient was brought to surgery and underwent the procedure noted above.  The patient tolerated the procedure well and was transferred to recovery in stable condition.  The patient was evaluated by me postoperatively in the morning of postoperative day #1.  The patient was noted to be neurovascularly intact, and her left arm pain was entirely resolved.  She did have some minimal tingling in the index finger on the left side, but again, her severe radicular pain was resolved.  She had minimal neck discomfort. The patient was discharged home on the morning of postoperative day #1. She was neurovascularly intact.  DISCHARGE INSTRUCTIONS:  The patient will take Percocet for pain and Valium for spasms.  She will avoid lifting over 10 pounds and wear her Aspen cervical collar at all  times.  She was given a [place] collar to be used while showering.  She will follow up with me in approximately 2 weeks after her surgery.     Estill Bamberg, MD     MD/MEDQ  D:  03/07/2012  T:  03/07/2012  Job:  [id number]

## 2012-03-10 ENCOUNTER — Encounter (HOSPITAL_COMMUNITY): Payer: Self-pay | Admitting: Orthopedic Surgery

## 2012-05-25 ENCOUNTER — Telehealth: Payer: Self-pay | Admitting: Internal Medicine

## 2012-05-25 NOTE — Telephone Encounter (Signed)
Due for OV, please arrange

## 2012-05-27 NOTE — Telephone Encounter (Signed)
lmovm

## 2012-06-05 ENCOUNTER — Other Ambulatory Visit: Payer: Self-pay | Admitting: Obstetrics and Gynecology

## 2012-06-05 ENCOUNTER — Encounter: Payer: Self-pay | Admitting: Internal Medicine

## 2012-06-05 DIAGNOSIS — R928 Other abnormal and inconclusive findings on diagnostic imaging of breast: Secondary | ICD-10-CM

## 2012-06-05 NOTE — Telephone Encounter (Signed)
lmovm

## 2012-06-11 ENCOUNTER — Ambulatory Visit
Admission: RE | Admit: 2012-06-11 | Discharge: 2012-06-11 | Disposition: A | Discharge status: Home / Self Care | Payer: 59 | Source: Ambulatory Visit | Attending: Obstetrics and Gynecology | Admitting: Obstetrics and Gynecology

## 2012-06-11 DIAGNOSIS — R928 Other abnormal and inconclusive findings on diagnostic imaging of breast: Secondary | ICD-10-CM

## 2012-06-23 ENCOUNTER — Encounter: Payer: Self-pay | Admitting: Internal Medicine

## 2012-06-23 NOTE — Telephone Encounter (Signed)
Mailed letter 06-23-12. BC

## 2012-10-29 DIAGNOSIS — C55 Malignant neoplasm of uterus, part unspecified: Secondary | ICD-10-CM

## 2012-10-29 DIAGNOSIS — Z1509 Genetic susceptibility to other malignant neoplasm: Secondary | ICD-10-CM

## 2012-10-29 HISTORY — DX: Malignant neoplasm of uterus, part unspecified: C55

## 2012-10-29 HISTORY — DX: Genetic susceptibility to other malignant neoplasm: Z15.09

## 2012-11-26 ENCOUNTER — Other Ambulatory Visit: Payer: Self-pay | Admitting: Obstetrics and Gynecology

## 2012-11-26 DIAGNOSIS — R928 Other abnormal and inconclusive findings on diagnostic imaging of breast: Secondary | ICD-10-CM

## 2012-12-15 ENCOUNTER — Ambulatory Visit
Admission: RE | Admit: 2012-12-15 | Discharge: 2012-12-15 | Disposition: A | Discharge status: Home / Self Care | Payer: 59 | Source: Ambulatory Visit | Attending: Obstetrics and Gynecology | Admitting: Obstetrics and Gynecology

## 2012-12-15 ENCOUNTER — Other Ambulatory Visit: Payer: Self-pay | Admitting: Obstetrics and Gynecology

## 2012-12-15 DIAGNOSIS — R928 Other abnormal and inconclusive findings on diagnostic imaging of breast: Secondary | ICD-10-CM

## 2013-02-02 ENCOUNTER — Encounter (HOSPITAL_COMMUNITY): Payer: Self-pay

## 2013-02-02 ENCOUNTER — Encounter: Payer: Self-pay | Admitting: Pharmacy Technician

## 2013-02-02 ENCOUNTER — Encounter (HOSPITAL_COMMUNITY)
Admission: RE | Admit: 2013-02-02 | Discharge: 2013-02-02 | Disposition: A | Discharge status: Home / Self Care | Payer: 59 | Source: Ambulatory Visit | Attending: Obstetrics and Gynecology | Admitting: Obstetrics and Gynecology

## 2013-02-02 HISTORY — DX: Type 2 diabetes mellitus without complications: E11.9

## 2013-02-02 LAB — CBC
HCT: 38.8 % (ref 36.0–46.0)
Hemoglobin: 13 g/dL (ref 12.0–15.0)
MCH: 31.1 pg (ref 26.0–34.0)
MCHC: 33.5 g/dL (ref 30.0–36.0)

## 2013-02-02 NOTE — Patient Instructions (Addendum)
Your procedure is scheduled on:02/09/13  Enter through the Main Entrance at : 6am Pick up desk phone and dial [phone] and inform us of your arrival.  Please call [phone] if you have any problems the morning of surgery.  Remember: Do not eat or drink after midnight:Monday   DO NOT wear jewelry, eye make-up, lipstick,body lotion, or dark fingernail polish.   Patients discharged on the day of surgery will not be allowed to drive home.

## 2013-02-09 ENCOUNTER — Encounter (HOSPITAL_COMMUNITY): Payer: Self-pay | Admitting: Anesthesiology

## 2013-02-09 ENCOUNTER — Ambulatory Visit (HOSPITAL_COMMUNITY)
Admission: RE | Admit: 2013-02-09 | Discharge: 2013-02-09 | Disposition: A | Discharge status: Home / Self Care | Payer: 59 | Source: Ambulatory Visit | Attending: Obstetrics and Gynecology | Admitting: Obstetrics and Gynecology

## 2013-02-09 ENCOUNTER — Encounter (HOSPITAL_COMMUNITY)
Admission: RE | Disposition: A | Discharge status: Home / Self Care | Payer: Self-pay | Source: Ambulatory Visit | Attending: Obstetrics and Gynecology

## 2013-02-09 ENCOUNTER — Encounter (HOSPITAL_COMMUNITY): Payer: Self-pay | Admitting: *Deleted

## 2013-02-09 ENCOUNTER — Ambulatory Visit (HOSPITAL_COMMUNITY): Payer: 59 | Admitting: Anesthesiology

## 2013-02-09 DIAGNOSIS — N920 Excessive and frequent menstruation with regular cycle: Secondary | ICD-10-CM | POA: Insufficient documentation

## 2013-02-09 DIAGNOSIS — N8502 Endometrial intraepithelial neoplasia [EIN]: Secondary | ICD-10-CM | POA: Insufficient documentation

## 2013-02-09 HISTORY — PX: HYSTEROSCOPY WITH NOVASURE: SHX5574

## 2013-02-09 SURGERY — HYSTEROSCOPY WITH NOVASURE
Anesthesia: General | Site: Vagina | Wound class: Clean Contaminated

## 2013-02-09 MED ORDER — PHENYLEPHRINE HCL 10 MG/ML IJ SOLN
INTRAMUSCULAR | Status: DC | PRN
  Administered 2013-02-09: 80 ug via INTRAVENOUS
  Administered 2013-02-09 (×8): 40 ug via INTRAVENOUS
  Administered 2013-02-09: 80 ug via INTRAVENOUS
  Administered 2013-02-09 (×4): 40 ug via INTRAVENOUS

## 2013-02-09 MED ORDER — LIDOCAINE HCL 1 % IJ SOLN
INTRAMUSCULAR | Status: DC | PRN
  Administered 2013-02-09: 10 mL

## 2013-02-09 MED ORDER — FENTANYL CITRATE 0.05 MG/ML IJ SOLN
INTRAMUSCULAR | Status: AC
  Administered 2013-02-09: 50 ug via INTRAVENOUS
  Filled 2013-02-09: qty 2

## 2013-02-09 MED ORDER — FENTANYL CITRATE 0.05 MG/ML IJ SOLN
INTRAMUSCULAR | Status: DC | PRN
  Administered 2013-02-09 (×2): 50 ug via INTRAVENOUS

## 2013-02-09 MED ORDER — PROPOFOL 10 MG/ML IV EMUL
INTRAVENOUS | Status: DC | PRN
  Administered 2013-02-09: 150 mg via INTRAVENOUS

## 2013-02-09 MED ORDER — KETOROLAC TROMETHAMINE 30 MG/ML IJ SOLN
INTRAMUSCULAR | Status: DC | PRN
  Administered 2013-02-09: 30 mg via INTRAMUSCULAR

## 2013-02-09 MED ORDER — PROPOFOL 10 MG/ML IV EMUL
INTRAVENOUS | Status: AC
  Filled 2013-02-09: qty 20

## 2013-02-09 MED ORDER — LACTATED RINGERS IR SOLN
Status: DC | PRN
  Administered 2013-02-09: 3000 mL

## 2013-02-09 MED ORDER — OXYCODONE-ACETAMINOPHEN 10-325 MG PO TABS: 1.0000 | ORAL_TABLET | ORAL | Status: DC | PRN

## 2013-02-09 MED ORDER — KETOROLAC TROMETHAMINE 30 MG/ML IJ SOLN
INTRAMUSCULAR | Status: AC
  Filled 2013-02-09: qty 1

## 2013-02-09 MED ORDER — LACTATED RINGERS IV SOLN
INTRAVENOUS | Status: DC
  Administered 2013-02-09 (×4): via INTRAVENOUS

## 2013-02-09 MED ORDER — LIDOCAINE HCL (CARDIAC) 20 MG/ML IV SOLN
INTRAVENOUS | Status: AC
  Filled 2013-02-09: qty 5

## 2013-02-09 MED ORDER — FENTANYL CITRATE 0.05 MG/ML IJ SOLN
25.0000 ug | INTRAMUSCULAR | Status: DC | PRN
  Administered 2013-02-09: 25 ug via INTRAVENOUS

## 2013-02-09 MED ORDER — ONDANSETRON HCL 4 MG/2ML IJ SOLN
INTRAMUSCULAR | Status: AC
  Filled 2013-02-09: qty 2

## 2013-02-09 MED ORDER — PHENYLEPHRINE 40 MCG/ML (10ML) SYRINGE FOR IV PUSH (FOR BLOOD PRESSURE SUPPORT)
PREFILLED_SYRINGE | INTRAVENOUS | Status: AC
  Filled 2013-02-09: qty 5

## 2013-02-09 MED ORDER — MIDAZOLAM HCL 5 MG/5ML IJ SOLN
INTRAMUSCULAR | Status: DC | PRN
  Administered 2013-02-09 (×2): 1 mg via INTRAVENOUS

## 2013-02-09 MED ORDER — FENTANYL CITRATE 0.05 MG/ML IJ SOLN
INTRAMUSCULAR | Status: AC
  Filled 2013-02-09: qty 4

## 2013-02-09 MED ORDER — MIDAZOLAM HCL 2 MG/2ML IJ SOLN
INTRAMUSCULAR | Status: AC
  Filled 2013-02-09: qty 2

## 2013-02-09 MED ORDER — LIDOCAINE HCL (CARDIAC) 20 MG/ML IV SOLN
INTRAVENOUS | Status: DC | PRN
  Administered 2013-02-09: 60 mg via INTRAVENOUS

## 2013-02-09 MED ORDER — PHENYLEPHRINE 40 MCG/ML (10ML) SYRINGE FOR IV PUSH (FOR BLOOD PRESSURE SUPPORT)
PREFILLED_SYRINGE | INTRAVENOUS | Status: AC
  Filled 2013-02-09: qty 10

## 2013-02-09 MED ORDER — SODIUM CHLORIDE 0.9 % IR SOLN
Status: DC | PRN
  Administered 2013-02-09: 3000 mL

## 2013-02-09 SURGICAL SUPPLY — 12 items
ABLATOR ENDOMETRIAL BIPOLAR (ABLATOR) ×2 IMPLANT
CATH ROBINSON RED A/P 16FR (CATHETERS) ×2 IMPLANT
CLOTH BEACON ORANGE TIMEOUT ST (SAFETY) ×2 IMPLANT
CONTAINER PREFILL 10% NBF 60ML (FORM) IMPLANT
DRESSING TELFA 8X3 (GAUZE/BANDAGES/DRESSINGS) ×2 IMPLANT
GLOVE ECLIPSE 7.0 STRL STRAW (GLOVE) ×4 IMPLANT
GOWN STRL REIN XL XLG (GOWN DISPOSABLE) ×4 IMPLANT
PACK HYSTEROSCOPY LF (CUSTOM PROCEDURE TRAY) ×2 IMPLANT
PAD OB MATERNITY 4.3X12.25 (PERSONAL CARE ITEMS) ×2 IMPLANT
SET GENESYS HTA PROCERVA (MISCELLANEOUS) ×2 IMPLANT
TOWEL OR 17X24 6PK STRL BLUE (TOWEL DISPOSABLE) ×4 IMPLANT
WATER STERILE IRR 1000ML POUR (IV SOLUTION) ×2 IMPLANT

## 2013-02-09 NOTE — Transfer of Care (Signed)
Immediate Anesthesia Transfer of Care Note  Patient: Olivia Sweeney  Procedure(s) Performed: Procedure(s): HYSTEROSCOPY WITH NOVASURE (N/A)  Patient Location: PACU  Anesthesia Type:General  Level of Consciousness: awake, alert , oriented and patient cooperative  Airway & Oxygen Therapy: Patient Spontanous Breathing and Patient connected to nasal cannula oxygen  Post-op Assessment: Report given to PACU RN and Post -op Vital signs reviewed and stable  Post vital signs: Reviewed and stable  Complications: No apparent anesthesia complications

## 2013-02-09 NOTE — H&P (Signed)
Pt is a 47 yr old white female who presents to the OR for hysteroscopy, D&C, and Novasure secondary to menorrhagia.  PE: VSSAF         HEENT-wnl         ABD- soft, nontender,         Pelvic- deferred to the or. IMP/Menorrhagia Plan/ To OR for D&C, Hysteroscopy, Novasure

## 2013-02-09 NOTE — Anesthesia Preprocedure Evaluation (Addendum)
Anesthesia Evaluation  Patient identified by MRN, date of birth, ID band Patient awake    Reviewed: Allergy & Precautions, H&P , NPO status , Patient's Chart, lab work & pertinent test results, reviewed documented beta blocker date and time   History of Anesthesia Complications Negative for: history of anesthetic complications  Airway Mallampati: I TM Distance: >3 FB Neck ROM: Full    Dental no notable dental hx. (+) Teeth Intact and Dental Advisory Given,    Pulmonary neg pulmonary ROS,  breath sounds clear to auscultation  Pulmonary exam normal       Cardiovascular Exercise Tolerance: Good negative cardio ROS  Rhythm:regular Rate:Normal     Neuro/Psych L hand tingling and numbness     GI/Hepatic negative GI ROS, Neg liver ROS,   Endo/Other  diabetes, Well Controlled, Type 2  Renal/GU negative Renal ROS     Musculoskeletal  (+) Arthritis - (neck), Osteoarthritis,    Abdominal   Peds  Hematology negative hematology ROS (+)   Anesthesia Other Findings   Reproductive/Obstetrics negative OB ROS                          Anesthesia Physical  Anesthesia Plan  ASA: II  Anesthesia Plan: General   Post-op Pain Management:    Induction: Intravenous  Airway Management Planned: LMA  Additional Equipment:   Intra-op Plan:   Post-operative Plan: Extubation in OR  Informed Consent: I have reviewed the patients History and Physical, chart, labs and discussed the procedure including the risks, benefits and alternatives for the proposed anesthesia with the patient or authorized representative who has indicated his/her understanding and acceptance.   Dental advisory given  Plan Discussed with: CRNA  Anesthesia Plan Comments: (  Discussed  general anesthesia, including possible nausea, instrumentation of airway, sore throat,pulmonary aspiration, etc. I asked if the were any outstanding  questions, or  concerns before we proceeded. )       Anesthesia Quick Evaluation

## 2013-02-09 NOTE — Preoperative (Signed)
Beta Blockers   Reason not to administer Beta Blockers:Not Applicable 

## 2013-02-09 NOTE — Anesthesia Postprocedure Evaluation (Signed)
  Anesthesia Post-op Note  Patient: Olivia Sweeney  Procedure(s) Performed: Procedure(s): HYSTEROSCOPY WITH NOVASURE (N/A) Patient is awake and responsive. Pain and nausea are reasonably well controlled. Vital signs are stable and clinically acceptable. Oxygen saturation is clinically acceptable. There are no apparent anesthetic complications at this time. Patient is ready for discharge.

## 2013-02-10 ENCOUNTER — Encounter (HOSPITAL_COMMUNITY): Payer: Self-pay | Admitting: Obstetrics and Gynecology

## 2013-02-10 NOTE — Op Note (Signed)
Olivia Sweeney, Olivia Sweeney NO.:  [id number]  MEDICAL RECORD NO.:  [id number]  LOCATION:  [facility]                          FACILITY:  [facility]  PHYSICIAN:  Malva Limes, M.D.    DATE OF BIRTH:  07/10/1966  DATE OF PROCEDURE:  02/09/2013 DATE OF DISCHARGE:  02/09/2013                              OPERATIVE REPORT   PREOPERATIVE DIAGNOSIS:  Menorrhagia.  POSTOPERATIVE DIAGNOSIS:  Menorrhagia.  PROCEDURES: 1. Hysteroscopy. 2. Dilation and curettage. 3. NovaSure endometrial ablation.  SURGEON:  Malva Limes, M.D.  ANESTHESIA:  General and local.  ANTIBIOTICS:  None.  DRAINS:  Red rubber catheter to bladder.  SPECIMENS:  Endometrial curettings and polyp sent to Pathology.  DESCRIPTION OF PROCEDURE:  The patient was taken to the operating room where she was placed in dorsal supine position.  A general anesthetic was administered without difficulty.  She was then placed in dorsal lithotomy position.  She was prepped and draped in the usual fashion for this procedure.  An exam under anesthesia revealed anteverted uterus of normal size and shape.  A sterile speculum was placed in the vagina. 10 mL of 1% lidocaine was used for paracervical block.  A single-tooth tenaculum was applied to the anterior cervical lip.  The cervix was serially dilated to a 29-French.  The hysteroscope was passed through the endocervical canal.  On entering the uterine cavity, multiple endometrial polyps were identified.  It was felt that the patient possibly had the uterine septum during the hysteroscopy.  Sharp curettage was performed with tissue being removed and sent to Pathology. Another hysteroscopy was performed, which revealed that the majority of the polyps were removed.  A polyp forceps was placed into uterine cavity and a last polyp was removed.  At this point, because of the possibility of a uterine septum, the HTA device was set up.  The device was placed  was placed into the  uterine cavity and a fluid loss test was performed and failed 3 times.  It was felt the patient did not have a uterine perforation and that a seal around the cervix was good.  No reason for the fluid loss could be identified. Because of this, the HTA procedure was discontinued and a NovaSure device was placed into the uterine cavity.  The uterine length was measured at 5.5 cm.  The width after the device was placed was 4.7 cm. A seal test was performed and passed.  Device was turned on.  The patient tolerated the procedure well.  Device was removed.  The patient was awoke and taken to the recovery room in stable condition. Instrument and lap counts were correct x2.  We will await the pathology report.  The patient was discharged home.  She was sent home with Percocet to take p.r.n.  She will follow up in the office in 4 weeks.          ______________________________ Malva Limes, M.D.     MA/MEDQ  D:  02/09/2013  T:  02/10/2013  Job:  [id number]

## 2013-02-25 ENCOUNTER — Ambulatory Visit: Payer: 59 | Attending: Gynecologic Oncology | Admitting: Gynecologic Oncology

## 2013-02-25 ENCOUNTER — Encounter: Payer: Self-pay | Admitting: Gynecologic Oncology

## 2013-02-25 VITALS — BP 168/98 | HR 120 | Temp 98.8°F | Resp 22 | Ht 65.0 in | Wt 128.0 lb

## 2013-02-25 DIAGNOSIS — C541 Malignant neoplasm of endometrium: Secondary | ICD-10-CM

## 2013-02-25 DIAGNOSIS — C549 Malignant neoplasm of corpus uteri, unspecified: Secondary | ICD-10-CM | POA: Insufficient documentation

## 2013-02-25 NOTE — Patient Instructions (Signed)
Hysterectomy Information  A hysterectomy is a procedure where your uterus is surgically removed. It will no longer be possible to have menstrual periods or to become pregnant. The tubes and ovaries can be removed (bilateral salpingo-oopherectomy) during this surgery as well.  REASONS FOR A HYSTERECTOMY  Persistent, abnormal bleeding.  Lasting (chronic) pelvic pain or infection.  The lining of the uterus (endometrium) starts growing outside the uterus (endometriosis).  The endometrium starts growing in the muscle of the uterus (adenomyosis).  The uterus falls down into the vagina (pelvic organ prolapse).  Symptomatic uterine fibroids.  Precancerous cells.  Cervical cancer or uterine cancer. TYPES OF HYSTERECTOMIES  Supracervical hysterectomy. This type removes the top part of the uterus, but not the cervix.  Total hysterectomy. This type removes the uterus and cervix.  Radical hysterectomy. This type removes the uterus, cervix, and the fibrous tissue that holds the uterus in place in the pelvis (parametrium). WAYS A HYSTERECTOMY CAN BE PERFORMED  Abdominal hysterectomy. A large surgical cut (incision) is made in the abdomen. The uterus is removed through this incision.  Vaginal hysterectomy. An incision is made in the vagina. The uterus is removed through this incision. There are no abdominal incisions.  Conventional laparoscopic hysterectomy. A thin, lighted tube with a camera (laparoscope) is inserted into 3 or 4 small incisions in the abdomen. The uterus is cut into small pieces. The small pieces are removed through the incisions, or they are removed through the vagina.  Laparoscopic assisted vaginal hysterectomy (LAVH). Three or four small incisions are made in the abdomen. Part of the surgery is performed laparoscopically and part vaginally. The uterus is removed through the vagina.  Robot-assisted laparoscopic hysterectomy. A laparoscope is inserted into 3 or 4 small  incisions in the abdomen. A computer-controlled device is used to give the surgeon a 3D image. This allows for more precise movements of surgical instruments. The uterus is cut into small pieces and removed through the incisions or removed through the vagina. RISKS OF HYSTERECTOMY   Bleeding and risk of blood transfusion. Tell your caregiver if you do not want to receive any blood products.  Blood clots in the legs or lung.  Infection.  Injury to surrounding organs.  Anesthesia problems or side effects.  Conversion to an abdominal hysterectomy. WHAT TO EXPECT AFTER A HYSTERECTOMY  You will be given pain medicine.  You will need to have someone with you for the first 3 to 5 days after you go home.  You will need to follow up with your surgeon in 2 to 4 weeks after surgery to evaluate your progress.  You may have early menopause symptoms like hot flashes, night sweats, and insomnia.  If you had a hysterectomy for a problem that was not a cancer or a condition that could lead to cancer, then you no longer need Pap tests. However, even if you no longer need a Pap test, a regular exam is a good idea to make sure no other problems are starting. Document Released: 04/10/2001 Document Revised: 01/07/2012 Document Reviewed: 05/26/2011 [facility] Patient Information 2013 [place], [place]. Cancer of the Uterus The uterus is part of a woman's reproductive system. It is the hollow, pear-shaped organ where a baby grows. The uterus is in the pelvis between the bladder and the rectum. The narrow, lower portion of the uterus is the cervix. The fallopian tubes extend from either side of the top of the uterus to the ovaries. The wall of the uterus has two layers of tissue.  The inner layer, or lining, is the endometrium. The outer layer is muscle tissue called the myometrium. In women of childbearing age, the lining of the uterus grows and thickens each month to prepare for pregnancy. If a woman does not  become pregnant, the thick, bloody lining flows out of the body through the vagina. This flow is called menstruation. TYPES OF UTERINE CANCER  The most common type of cancer of the uterus begins in the lining (endometrium). It is called endometrial cancer, uterine cancer, or cancer of the uterus. It is seen in 2% to 3% of women.  A different type of cancer, uterine sarcoma, develops in the muscle (myometrium). Cancer that begins in the cervix is also a different type of cancer.  Rarely, a noncancerous fibroid tumor of the uterus develops into a sarcoma. CAUSES  No one knows the exact causes of uterine cancer. But it is clear that this disease is not contagious. No one can "catch" cancer from another person. Women who get this disease are more likely than other women to have certain risk factors. A risk factor is something that increases a person's chance of developing the disease.  Most women who have known risk factors do not get uterine cancer. On the other hand, many who do get this disease have none of these factors. Doctors can seldom explain why one woman gets uterine cancer and another does not.  Studies have found the following risk factors:  Age. Cancer of the uterus occurs mostly in women over age 61.  Endometrial hyperplasia (enlarged endometrium). The risk of uterine cancer is higher if a woman has endometrial hyperplasia.  Hormone replacement therapy (HRT). HRT is used to control the symptoms of menopause, to prevent osteoporosis (thinning of the bones), and to reduce the risk of heart disease or stroke. Women who still have their uterus, and use estrogen without progesterone, have an increased risk of uterine cancer. Long-term use and large doses of estrogen seem to increase this risk. Women who use a combination of estrogen and progesterone have a lower risk of uterine cancer than women who use estrogen alone. The progesterone protects the uterus from developing cancer.  Obesity  and related conditions. The body stores and releases some of its estrogen in fatty tissue. That is why obese women are more likely than thin women to have higher levels of estrogen in their bodies. High levels of estrogen may be the reason that obese women have an increased risk of developing uterine cancer. The risk of this disease is also higher in women with diabetes or high blood pressure. These conditions occur in many obese women.  Tamoxifen. Women taking the drug tamoxifen to prevent or treat breast cancer have an increased risk of uterine cancer. This risk appears to be related to the estrogen-like effect of this drug on the uterus.  Race. White women are more likely than African-American women to get uterine cancer.  Colorectal cancer. Women who have had an inherited form of colorectal cancer have a higher risk of developing uterine cancer than other women.  Infertility.  Beginning menstrual periods before age 42.  Having menstrual periods after age 42.  History of cancer of the ovary or intestine.  Family history of uterine cancer.  Having diabetes, high blood pressure, thyroid or gallbladder disease.  Long-term use of high does of birth control pills. Birth control pills today are low in hormone doses.  Radiation to the abdomen or pelvis.  Smoking. SYMPTOMS  Uterine cancer usually occurs  after menopause. But it may also occur around the time that menopause begins. Abnormal vaginal bleeding is the most common symptom of uterine cancer. Bleeding may start as a watery, blood-streaked flow that gradually contains more blood. Women should not assume that abnormal vaginal bleeding is part of menopause. A woman should see her caregiver if she has any of the following symptoms:  Unusual vaginal bleeding or discharge.  Difficult or painful urination.  Pain during intercourse.  Pain in the pelvic area.  Increased girth (growth) of the stomach.  Any vaginal bleeding after  menopause.  Unexplained weight loss. These symptoms can be caused by cancer or other less serious conditions. Most often they are not cancer. But a thorough evaluation is needed to be certain. DIAGNOSIS  If a woman has symptoms that suggest uterine cancer, her caregiver may check her general health and may order blood and urine tests. The caregiver also may perform one or more of these exams or tests.  Blood and urine tests and chest x-rays. The woman also may have:  Other X-rays.  CT scans.  Ultrasound test.  Magnetic resonance imaging (MRI).  Sigmoidoscopy.  Colonoscopy.  Pelvic exam. A woman will have a pelvic exam to check the vagina, uterus, bladder, and rectum. The caregiver feels these organs for any lumps or changes in their shape or size. To see the upper part of the vagina and the cervix, the caregiver inserts an instrument called a speculum into the vagina.  Pap test. The caregiver collects cells from the cervix and upper vagina. A medical laboratory checks for abnormal cells. The Pap test is better for detecting cancer of the cervix. But cells from inside the uterus usually do not show up on a Pap test. It is not a reliable test for uterine cancer.  Transvaginal ultrasound. The medical caregiver inserts an instrument into the vagina. The instrument aims high-frequency sound waves at the uterus. The pattern of the echoes they produce creates a picture. If the endometrium looks too thick, the caregiver can do a biopsy.  Biopsy. The medical caregiver removes a sample of tissue from the uterine lining. This usually can be done in the caregiver's office.  Dilatation and Curettage (D&C). In some cases, a woman may need to have a D&C. D&C is usually done as same-day surgery with anesthesia in a hospital. A pathologist examines the tissue (lining of the uterus) to check for cancer cells and other conditions. STAGING   If uterine cancer is diagnosed, the caregiver needs to know the  stage, or extent, of the disease to plan the best treatment. Staging is a careful attempt to find out whether the cancer has spread, and if so, to what parts of the body.  When uterine cancer spreads (metastasizes) outside the uterus, cancer cells are often found in nearby lymph nodes, nerves, or blood vessels. If the cancer has reached the lymph nodes, cancer cells may have spread to other lymph nodes and other organs of the body.  Staging is done at the time of surgery. In most cases, the most reliable way to stage this disease is to remove the uterus, cervix, tubes, ovaries, and lymph nodes. A pathologist uses a microscope to examine the uterus and other tissues removed by the surgeon, to determine the extent of the cancer in the pelvis.  If lymph nodes have cancer cells, other parts of the body are examined, to see if it has spread to other organs. MAIN FEATURES OF EACH STAGE OF THE DISEASE:  Stage I. The cancer is only in the body of the uterus. It is not in the cervix. Stage II. The cancer has spread from the body of the uterus to the cervix. Stage III. The cancer has spread outside the uterus, but not outside the pelvis (and not to the bladder or rectum). Lymph nodes in the pelvis may contain cancer cells. Stage IV. The cancer has spread into the bladder or rectum. It may have spread beyond the pelvis to other body parts. TREATMENT  Women with uterine cancer have many treatment options. Most women with uterine cancer are treated with surgery. Some have radiation or chemotherapy. A smaller number of women may be treated with hormonal therapy. Some patients receive a combination of therapies. You may want to consult with another cancer doctor for a second opinion. The caregiver (usually a cancer doctor) is the best person to describe your treatment choices and to discuss the expected results of treatment. SURGERY  Most women with uterine cancer have surgery to remove the uterus, cervix, tubes,  and ovaries (total hysterectomy). This is usually done through an incision in the abdomen.  The doctor may also remove the lymph nodes near the tumor, to see if they contain cancer. If cancer cells have reached the lymph nodes, it may mean that the disease has spread to other parts of the body. If cancer cells have not spread beyond the endometrium, the woman may not need to have any other treatment. The length of the hospital stay may vary from several days to a week. RADIATION THERAPY  In radiation therapy, high-energy rays are used to kill cancer cells. Like surgery, radiation therapy is a local therapy. It affects cancer cells only in the treated area.  Some women with Stage I, II, or III uterine cancer need both radiation therapy and surgery. They may have radiation before surgery to shrink the tumor, or after surgery to destroy any cancer cells that remain in the area. The doctor may suggest radiation treatments for the small number of women who cannot have surgery.  Doctors use two types of radiation therapy to treat uterine cancer:  External radiation. In external radiation therapy, a large machine outside the body is used to aim radiation at the tumor area. The woman usually does not stay overnight (outpatient) at the hospital or clinic, and receives external radiation 5 days a week for several weeks. This schedule helps protect healthy cells and tissue by spreading out the total dose of radiation. No radioactive materials are put into the body for external radiation therapy.  Internal radiation. In internal radiation therapy, tiny tubes containing a radioactive substance are inserted through the vagina and cervix, into the uterus, and left in place for a few days. The woman stays in the hospital during this treatment. To protect others from radiation exposure, the patient may not be able to have visitors or may have visitors only for a short period of time while the implant is in place. Once  the implant is removed, the woman has no radioactivity in her body.  Some patients need both external and internal radiation therapies. CHEMOTHERAPY Chemotherapy is not usually used for endometrial cancer of the uterus. However, with sarcoma of the uterus or of the fibroid, it may be used in combination with surgery. Chemotherapy may also be used with recurring sarcoma, and in patients who cannot have surgery. HORMONE THERAPY Hormonal therapy involves substances that prevent cancer cells from multiplying or growing by attaching to hormone receptors. This  causes changes in cancer cells. Before therapy begins, the caregiver may request a hormone receptor test. This special lab test of uterine tissue helps the caregiver learn if estrogen and progesterone receptors are present. If the tissue has receptors, the woman is more likely to respond to hormonal therapy.  Hormonal therapy is called a systemic therapy, because it can affect cancer cells throughout the body. Usually, hormonal therapy is a type of progesterone, taken as a pill or injection.  The doctor may use hormonal therapy for women with uterine cancer who are unable to have surgery or radiation therapy. Also, the doctor may give hormonal therapy to women with uterine cancer that has spread to the lungs or other distant sites. It is also given to women with uterine cancer that has come back.  Hormonal therapy can cause a number of side effects. Women taking progesterone may retain fluid, have an increased appetite, and gain weight. Women who are still menstruating may have changes in their periods.  Hormone therapy can be used in combination with surgery or radiation. HOME CARE INSTRUCTIONS   Maintain a normal weight with a healthy balanced diet and exercise.  If you have diabetes, high blood pressure, thyroid or gallbladder disease, keep them in control with your caregiver's treatment and recommendations.  Do not smoke.  Do not take  estrogen without taking progesterone with it, for menopausal symptoms.  Join a support group or get counseling, if you would like help dealing with your cancer.  If you are on hormone replacement therapy, see your caregiver as recommended, and be informed about the side effects of HRT.  Women with known risk factors should ask their caregiver what symptoms to look for and how often they should have an examination.  Keep your follow-up appointments and take your medicines as advised.  Write your questions down, and take them with you to your caregiver's appointments.  You may want another person to be with you for your appointments, so you do not miss any instructions. SEEK MEDICAL CARE IF:   You have any abnormal vaginal bleeding.  You are having menstrual periods at the age of 20 or older.  You have bleeding after sexual intercourse.  You are taking tomoxifen and develop vaginal bleeding.  Your stomach is growing, and you are not pregnant.  You have pain with sexual intercourse.  You have stomach or pelvis pain.  You have weight loss for no known reason.  You have pain or difficulty with urination. [facility]  Cancer Information Service (CIS) provides accurate, up-to-date information on cancer to patients and their families, health professionals, and the general public:  Phone: 1-800-4-CANCER (312-874-6939).  Internet: http://www.cancer.gov NCI's website contains complete information about cancer causes and prevention, screening and diagnosis, treatment and survivorship, clinical trials, statistics, funding, training, and employment opportunities, and [employer] and its programs. CLINICAL TRIALS A woman who is interested in being part of a clinical trial should talk with her caregiver. NCI's website (http://www.johnson-fowler.biz/) provides general information about clinical trials. It also offers detailed information about specific ongoing studies of  uterine cancer by linking to PDQ, a cancer information database developed by the NCI. The Cancer Information Service at 1-800-4-CANCER can answer questions about cancer and provide information from the PDQ database. Document Released: 10/15/2005 Document Revised: 01/07/2012 Document Reviewed: 08/18/2009 [facility] Patient Information 2013 [place], [place].

## 2013-02-25 NOTE — Progress Notes (Signed)
Consult Note: Gyn-Onc  Olivia Sweeney 47 y.o. female  CC:  Chief Complaint  Patient presents with  . Endometrial Cancer    New patient    HPI: Patient is seen today in consultation at the request of Dr. Anderson.  Patient is a 47-year-old gravida 0 who in late 2013 began having heavy bleeding and clots. She went to see Dr. Anderson her to establish yourself with GYN care. She had a Pap smear in January 23 that was negative. Should ultrasound revealed the uterus measuring 7.49 x 5.77 x 3.74 cm. The adnexa on the right was normal. She had several small centimeter fibroids. The left ovary was not well seen. The endometrium thickness was 15.5 mm. The patient subsequently underwent a hysteroscopy D&C and NovaSure ablation last week. Pathology from the D&C revealed a grade 1 endometrioid adenocarcinoma arising within extensive complex hyperplasia with atypia.   The bleeding that she was having was very heavy and she states would bleed through her clothes. She had essentially a menstrual cycle for 3 months with heavy clots. Since her ablation she's had some slight discharge but no bleeding. She does have some right lower quadrant pain today. She denies a change in her bowel or bladder habits otherwise. She denies any chest pain, shortness of breath, nausea, vomiting, or fevers. She did have chills after her surgery but that has not persisted.   Current Meds:  Outpatient Encounter Prescriptions as of 02/25/2013  Medication Sig Dispense Refill  . oxyCODONE-acetaminophen (PERCOCET) 10-325 MG per tablet Take 1 tablet by mouth every 4 (four) hours as needed for pain.  30 tablet  0   No facility-administered encounter medications on file as of 02/25/2013.    Allergy:  Allergies  Allergen Reactions  . Bee Venom Shortness Of Breath and Swelling  . Other Other (See Comments)    Cats; Reaction: sneezing, etc    Social Hx:   History   Social History  . Marital Status: Divorced    Spouse Name:  N/A    Number of Children: N/A  . Years of Education: N/A   Occupational History  . Not on file.   Social History Main Topics  . Smoking status: Never Smoker   . Smokeless tobacco: Not on file  . Alcohol Use: 0.0 oz/week    4-8 drink(s) per week     Comment: 14-21  . Drug Use: No  . Sexually Active: Not on file   Other Topics Concern  . Not on file   Social History Narrative  . No narrative on file    Past Surgical Hx:  Past Surgical History  Procedure Laterality Date  . Abdominal exploration surgery      looking for fibroids  . Dental implant      also had soft tissue graft after dental implant   . Anterior cervical decomp/discectomy fusion  03/06/2012    Procedure: ANTERIOR CERVICAL DECOMPRESSION/DISCECTOMY FUSION 1 LEVEL;  Surgeon: Mark Leonard Dumonski, MD;  Location: [facility] OR;  Service: Orthopedics;  Laterality: Left;  Anterior cervical decompression fusion, cervical 5-6, cervical 6-7 with instrumentation and allograft.  . Cataracts    . Hysteroscopy with novasure N/A 02/09/2013    Procedure: HYSTEROSCOPY WITH NOVASURE;  Surgeon: Mark E Anderson, MD;  Location: [facility] ORS;  Service: Gynecology;  Laterality: N/A;    Past Medical Hx:  Past Medical History  Diagnosis Date  . Diabetes mellitus without complication     diet controlled    Family Hx:  Family History    Problem Relation Age of Onset  . Breast cancer Mother   . Uterine cancer Mother   . Kidney cancer Mother   . Ovarian cancer Maternal Aunt   . Uterine cancer Maternal Grandmother   . Uterine cancer Other     Vitals:  Blood pressure 168/98, pulse 120, temperature 98.8 F (37.1 C), resp. rate 22, height 5' 5" (1.651 m), weight 128 lb (58.06 kg), last menstrual period 11/04/2012.  Physical Exam:  Well-nourished well-developed female in no acute distress.  Neck: Supple, no lymphadenopathy no thyromegaly.  Lungs: Clear to auscultation.  Cardiovascular: Regular rate and rhythm.  Abdomen: Soft, nontender,  nondistended. There is no palpable masses or hepatosplenomegaly.  Groins: No lymphadenopathy.  Extremities: No edema.  Pelvic: Normal external female genitalia. The cervix is visualized. There's no visible lesions. There's a thin Brown discharge. Bimanual examination there is no cervical motion tenderness. The corpus is of normal size shape and consistency. There are no adnexal masses.  Assessment/Plan: 47-year-old with a clinical stage I grade 1 endometrial carcinoma status post D&C NovaSure ablation. The diagnosis was made on the pathology from her D&C. I discussed with her that we would need to proceed with definitive surgical management. Proceed with a hysterectomy which would include removal of the cervix and bilateral salpingectomy and oophorectomy. We discussed different route of surgery I believe the patient would be a candidate for a minimally invasive surgery.  Surgery is tentatively scheduled for May 20. We will alert anesthesia to the amount of alcohol the patient drinks daily so that they can be aware and we will cover her in the postoperative period as well. I discussed with her that we would send the uterus for frozen section and that there may be some limitations to this based on the endometrial ablation. However, we will do a pelvic lymphadenectomy and pending the results of the frozen section we may do para-aortic lymphadenectomy.  Risks of surgery including but not limited to bleeding, infection, injury to surrounding organs and thromboembolic disease were discussed with the patient. She has a close friend was is an OB/GYN in [place] and we answered the questions that her friend had asked her to discuss with me. She also has a friend "Marcia" who accompanied her today and her questions were similarly answered. The patient will be traveling to see her father in [place] the week prior surgery.  So her preop appt. be scheduled for next week. Again, her questions were elicited and  answered to her satisfaction. She'll call me with any questions prior to her date of surgery.  Edwing Figley A., MD 02/25/2013, 4:54 PM  

## 2013-03-03 ENCOUNTER — Encounter (HOSPITAL_COMMUNITY): Payer: Self-pay | Admitting: Pharmacy Technician

## 2013-03-06 ENCOUNTER — Ambulatory Visit (HOSPITAL_COMMUNITY)
Admission: RE | Admit: 2013-03-06 | Discharge: 2013-03-06 | Disposition: A | Discharge status: Home / Self Care | Payer: 59 | Source: Ambulatory Visit | Attending: Gynecologic Oncology | Admitting: Gynecologic Oncology

## 2013-03-06 ENCOUNTER — Encounter (HOSPITAL_COMMUNITY): Payer: Self-pay

## 2013-03-06 ENCOUNTER — Encounter (HOSPITAL_COMMUNITY)
Admission: RE | Admit: 2013-03-06 | Discharge: 2013-03-06 | Disposition: A | Discharge status: Home / Self Care | Payer: 59 | Source: Ambulatory Visit | Attending: Gynecologic Oncology | Admitting: Gynecologic Oncology

## 2013-03-06 DIAGNOSIS — Z01818 Encounter for other preprocedural examination: Secondary | ICD-10-CM | POA: Insufficient documentation

## 2013-03-06 DIAGNOSIS — Z01812 Encounter for preprocedural laboratory examination: Secondary | ICD-10-CM | POA: Insufficient documentation

## 2013-03-06 DIAGNOSIS — C549 Malignant neoplasm of corpus uteri, unspecified: Secondary | ICD-10-CM | POA: Insufficient documentation

## 2013-03-06 LAB — COMPREHENSIVE METABOLIC PANEL
ALT: 14 U/L (ref 0–35)
AST: 18 U/L (ref 0–37)
Albumin: 3.5 g/dL (ref 3.5–5.2)
CO2: 23 mEq/L (ref 19–32)
Calcium: 9.6 mg/dL (ref 8.4–10.5)
Creatinine, Ser: 0.72 mg/dL (ref 0.50–1.10)
GFR calc non Af Amer: >90 mL/min (ref >90)
Sodium: 140 mEq/L (ref 135–145)
Total Protein: 7.3 g/dL (ref 6.0–8.3)

## 2013-03-06 LAB — CBC WITH DIFFERENTIAL/PLATELET
Basophils Absolute: 0 10*3/uL (ref 0.0–0.1)
Basophils Relative: 0 % (ref 0–1)
Eosinophils Relative: 2 % (ref 0–5)
Lymphocytes Relative: 20 % (ref 12–46)
MCHC: 34.2 g/dL (ref 30.0–36.0)
MCV: 91.4 fL (ref 78.0–100.0)
Platelets: 213 10*3/uL (ref 150–400)
RDW: 14.3 % (ref 11.5–15.5)
WBC: 7.4 10*3/uL (ref 4.0–10.5)

## 2013-03-06 LAB — SURGICAL PCR SCREEN: MRSA, PCR: INVALID — AB

## 2013-03-06 LAB — HCG, SERUM, QUALITATIVE: Preg, Serum: NEGATIVE

## 2013-03-06 NOTE — Patient Instructions (Addendum)
[address]  03/06/2013   Your procedure is scheduled on: 03-17-2013  Report to [facility] at 515  AM.  Call this number if you have problems the morning of surgery [phone]   Remember:   Do not eat food after midnight Sunday night, clear liquids all day Monday 03-16-2013, no liquids after midnight Monday night.      Take these medicines the morning of surgery with A SIP OF WATER: none                                SEE [facility] PREPARING FOR SURGERY SHEET   Do not wear jewelry, make-up or nail polish.  Do not wear lotions, powders, or perfumes. You may wear deodorant.   Men may shave face and neck.  Do not bring valuables to the hospital.  Contacts, dentures or bridgework may not be worn into surgery.  Leave suitcase in the car. After surgery it may be brought to your room.  For patients admitted to the hospital, checkout time is 11:00 AM the day of discharge.   Patients discharged the day of surgery will not be allowed to drive home.  Name and phone number of your driver:  Special Instructions: N/A   Please read over the following fact sheets that you were given: MRSA Information, blood fact sheet  Call Cain Sieve RN pre op nurse if needed [phone]    FAILURE TO FOLLOW THESE INSTRUCTIONS MAY RESULT IN THE CANCELLATION OF YOUR SURGERY. PATIENT SIGNATURE___________________________________________

## 2013-03-09 LAB — MRSA CULTURE

## 2013-03-17 ENCOUNTER — Ambulatory Visit (HOSPITAL_COMMUNITY)
Admission: RE | Admit: 2013-03-17 | Discharge: 2013-03-18 | Disposition: A | Discharge status: Home / Self Care | Payer: 59 | Source: Ambulatory Visit | Attending: Obstetrics & Gynecology | Admitting: Obstetrics & Gynecology

## 2013-03-17 ENCOUNTER — Ambulatory Visit (HOSPITAL_COMMUNITY): Payer: 59 | Admitting: Anesthesiology

## 2013-03-17 ENCOUNTER — Encounter (HOSPITAL_COMMUNITY): Payer: Self-pay | Admitting: *Deleted

## 2013-03-17 ENCOUNTER — Encounter (HOSPITAL_COMMUNITY): Payer: Self-pay | Admitting: Anesthesiology

## 2013-03-17 ENCOUNTER — Encounter (HOSPITAL_COMMUNITY)
Admission: RE | Disposition: A | Discharge status: Home / Self Care | Payer: Self-pay | Source: Ambulatory Visit | Attending: Obstetrics & Gynecology

## 2013-03-17 DIAGNOSIS — E119 Type 2 diabetes mellitus without complications: Secondary | ICD-10-CM | POA: Insufficient documentation

## 2013-03-17 DIAGNOSIS — N859 Noninflammatory disorder of uterus, unspecified: Secondary | ICD-10-CM | POA: Insufficient documentation

## 2013-03-17 DIAGNOSIS — D259 Leiomyoma of uterus, unspecified: Secondary | ICD-10-CM | POA: Insufficient documentation

## 2013-03-17 DIAGNOSIS — N8000 Endometriosis of the uterus, unspecified: Secondary | ICD-10-CM | POA: Insufficient documentation

## 2013-03-17 DIAGNOSIS — C549 Malignant neoplasm of corpus uteri, unspecified: Secondary | ICD-10-CM | POA: Insufficient documentation

## 2013-03-17 DIAGNOSIS — Z8041 Family history of malignant neoplasm of ovary: Secondary | ICD-10-CM | POA: Insufficient documentation

## 2013-03-17 DIAGNOSIS — C541 Malignant neoplasm of endometrium: Secondary | ICD-10-CM | POA: Diagnosis present

## 2013-03-17 DIAGNOSIS — Z803 Family history of malignant neoplasm of breast: Secondary | ICD-10-CM | POA: Insufficient documentation

## 2013-03-17 DIAGNOSIS — Z8049 Family history of malignant neoplasm of other genital organs: Secondary | ICD-10-CM | POA: Insufficient documentation

## 2013-03-17 DIAGNOSIS — Z8051 Family history of malignant neoplasm of kidney: Secondary | ICD-10-CM | POA: Insufficient documentation

## 2013-03-17 HISTORY — PX: LYMPH NODE DISSECTION: SHX5087

## 2013-03-17 HISTORY — PX: ROBOTIC ASSISTED TOTAL HYSTERECTOMY WITH BILATERAL SALPINGO OOPHERECTOMY: SHX6086

## 2013-03-17 LAB — TYPE AND SCREEN
ABO/RH(D): A POS
Antibody Screen: NEGATIVE

## 2013-03-17 LAB — GLUCOSE, CAPILLARY
Glucose-Capillary: 117 mg/dL — ABNORMAL HIGH (ref 70–99)
Glucose-Capillary: 164 mg/dL — ABNORMAL HIGH (ref 70–99)

## 2013-03-17 SURGERY — ROBOTIC ASSISTED TOTAL HYSTERECTOMY WITH BILATERAL SALPINGO OOPHORECTOMY
Anesthesia: General | Wound class: Clean Contaminated

## 2013-03-17 MED ORDER — ROCURONIUM BROMIDE 100 MG/10ML IV SOLN
INTRAVENOUS | Status: DC | PRN
  Administered 2013-03-17: 40 mg via INTRAVENOUS
  Administered 2013-03-17: 5 mg via INTRAVENOUS

## 2013-03-17 MED ORDER — LACTATED RINGERS IV SOLN
INTRAVENOUS | Status: DC | PRN
  Administered 2013-03-17: 07:00:00 via INTRAVENOUS

## 2013-03-17 MED ORDER — ONDANSETRON HCL 4 MG PO TABS: 4.0000 mg | ORAL_TABLET | Freq: Four times a day (QID) | ORAL | Status: DC | PRN

## 2013-03-17 MED ORDER — PROMETHAZINE HCL 25 MG/ML IJ SOLN: 6.2500 mg | INTRAMUSCULAR | Status: DC | PRN

## 2013-03-17 MED ORDER — STERILE WATER FOR IRRIGATION IR SOLN
Status: DC | PRN
  Administered 2013-03-17: 3000 mL

## 2013-03-17 MED ORDER — ACETAMINOPHEN 10 MG/ML IV SOLN
INTRAVENOUS | Status: DC | PRN
  Administered 2013-03-17: 1000 mg via INTRAVENOUS

## 2013-03-17 MED ORDER — NEOSTIGMINE METHYLSULFATE 1 MG/ML IJ SOLN
INTRAMUSCULAR | Status: DC | PRN
  Administered 2013-03-17: 3.5 mg via INTRAVENOUS

## 2013-03-17 MED ORDER — PROMETHAZINE HCL 25 MG/ML IJ SOLN
INTRAMUSCULAR | Status: AC
  Administered 2013-03-17: 6.25 mg
  Filled 2013-03-17: qty 1

## 2013-03-17 MED ORDER — LACTATED RINGERS IR SOLN
Status: DC | PRN
  Administered 2013-03-17: 1000 mL

## 2013-03-17 MED ORDER — KETOROLAC TROMETHAMINE 30 MG/ML IJ SOLN
30.0000 mg | Freq: Four times a day (QID) | INTRAMUSCULAR | Status: DC
  Filled 2013-03-17 (×4): qty 1

## 2013-03-17 MED ORDER — LACTATED RINGERS IV SOLN: INTRAVENOUS | Status: DC

## 2013-03-17 MED ORDER — LIDOCAINE HCL (CARDIAC) 20 MG/ML IV SOLN
INTRAVENOUS | Status: DC | PRN
  Administered 2013-03-17: 50 mg via INTRAVENOUS

## 2013-03-17 MED ORDER — FENTANYL CITRATE 0.05 MG/ML IJ SOLN
INTRAMUSCULAR | Status: AC
  Filled 2013-03-17: qty 2

## 2013-03-17 MED ORDER — KETOROLAC TROMETHAMINE 30 MG/ML IJ SOLN
30.0000 mg | Freq: Four times a day (QID) | INTRAMUSCULAR | Status: DC
  Administered 2013-03-17 – 2013-03-18 (×4): 30 mg via INTRAVENOUS
  Filled 2013-03-17 (×8): qty 1

## 2013-03-17 MED ORDER — HYDROMORPHONE HCL PF 1 MG/ML IJ SOLN: 0.5000 mg | INTRAMUSCULAR | Status: DC | PRN

## 2013-03-17 MED ORDER — ONDANSETRON HCL 4 MG/2ML IJ SOLN
INTRAMUSCULAR | Status: DC | PRN
  Administered 2013-03-17: 4 mg via INTRAVENOUS

## 2013-03-17 MED ORDER — CEFAZOLIN SODIUM-DEXTROSE 2-3 GM-% IV SOLR
INTRAVENOUS | Status: AC
  Filled 2013-03-17: qty 50

## 2013-03-17 MED ORDER — KCL IN DEXTROSE-NACL 20-5-0.45 MEQ/L-%-% IV SOLN
INTRAVENOUS | Status: DC
  Administered 2013-03-17 – 2013-03-18 (×3): via INTRAVENOUS
  Filled 2013-03-17 (×4): qty 1000

## 2013-03-17 MED ORDER — PHENYLEPHRINE HCL 10 MG/ML IJ SOLN
INTRAMUSCULAR | Status: DC | PRN
Start: 2013-03-17 — End: 2013-03-17
  Administered 2013-03-17 (×2): 40 ug via INTRAVENOUS

## 2013-03-17 MED ORDER — SUCCINYLCHOLINE CHLORIDE 20 MG/ML IJ SOLN
INTRAMUSCULAR | Status: DC | PRN
  Administered 2013-03-17: 100 mg via INTRAVENOUS

## 2013-03-17 MED ORDER — ACETAMINOPHEN 10 MG/ML IV SOLN
INTRAVENOUS | Status: AC
  Filled 2013-03-17: qty 100

## 2013-03-17 MED ORDER — GLYCOPYRROLATE 0.2 MG/ML IJ SOLN
INTRAMUSCULAR | Status: DC | PRN
  Administered 2013-03-17: .6 mg via INTRAVENOUS

## 2013-03-17 MED ORDER — MIDAZOLAM HCL 5 MG/5ML IJ SOLN
INTRAMUSCULAR | Status: DC | PRN
  Administered 2013-03-17: 2 mg via INTRAVENOUS

## 2013-03-17 MED ORDER — ZOLPIDEM TARTRATE 5 MG PO TABS: 5.0000 mg | ORAL_TABLET | Freq: Every evening | ORAL | Status: DC | PRN

## 2013-03-17 MED ORDER — OXYCODONE-ACETAMINOPHEN 5-325 MG PO TABS
1.0000 | ORAL_TABLET | ORAL | Status: DC | PRN
  Administered 2013-03-18: 2 via ORAL
  Filled 2013-03-17: qty 2

## 2013-03-17 MED ORDER — MEPERIDINE HCL 50 MG/ML IJ SOLN: 6.2500 mg | INTRAMUSCULAR | Status: DC | PRN

## 2013-03-17 MED ORDER — HYDROMORPHONE HCL PF 1 MG/ML IJ SOLN
0.5000 mg | INTRAMUSCULAR | Status: DC | PRN
  Administered 2013-03-17 – 2013-03-18 (×2): 0.5 mg via INTRAVENOUS
  Filled 2013-03-17 (×2): qty 1

## 2013-03-17 MED ORDER — HYDROMORPHONE HCL PF 1 MG/ML IJ SOLN
INTRAMUSCULAR | Status: DC | PRN
  Administered 2013-03-17 (×2): 1 mg via INTRAVENOUS

## 2013-03-17 MED ORDER — ONDANSETRON HCL 4 MG/2ML IJ SOLN: 4.0000 mg | Freq: Four times a day (QID) | INTRAMUSCULAR | Status: DC | PRN

## 2013-03-17 MED ORDER — PROPOFOL 10 MG/ML IV BOLUS
INTRAVENOUS | Status: DC | PRN
  Administered 2013-03-17: 150 mg via INTRAVENOUS

## 2013-03-17 MED ORDER — CEFAZOLIN SODIUM-DEXTROSE 2-3 GM-% IV SOLR
2.0000 g | INTRAVENOUS | Status: AC
  Administered 2013-03-17: 2 g via INTRAVENOUS

## 2013-03-17 MED ORDER — FENTANYL CITRATE 0.05 MG/ML IJ SOLN
25.0000 ug | INTRAMUSCULAR | Status: DC | PRN
  Administered 2013-03-17 (×4): 25 ug via INTRAVENOUS

## 2013-03-17 MED ORDER — FENTANYL CITRATE 0.05 MG/ML IJ SOLN
INTRAMUSCULAR | Status: DC | PRN
  Administered 2013-03-17: 100 ug via INTRAVENOUS
  Administered 2013-03-17 (×3): 50 ug via INTRAVENOUS

## 2013-03-17 SURGICAL SUPPLY — 49 items
BENZOIN TINCTURE PRP APPL 2/3 (GAUZE/BANDAGES/DRESSINGS) ×3 IMPLANT
CHLORAPREP W/TINT 26ML (MISCELLANEOUS) ×3 IMPLANT
CLOTH BEACON ORANGE TIMEOUT ST (SAFETY) ×3 IMPLANT
CORD HIGH FREQUENCY UNIPOLAR (ELECTROSURGICAL) ×3 IMPLANT
CORDS BIPOLAR (ELECTRODE) ×3 IMPLANT
COVER MAYO STAND STRL (DRAPES) IMPLANT
COVER SURGICAL LIGHT HANDLE (MISCELLANEOUS) ×3 IMPLANT
COVER TIP SHEARS 8 DVNC (MISCELLANEOUS) ×2 IMPLANT
COVER TIP SHEARS 8MM DA VINCI (MISCELLANEOUS) ×1
DRAPE LG THREE QUARTER DISP (DRAPES) ×6 IMPLANT
DRAPE SURG IRRIG POUCH 19X23 (DRAPES) ×3 IMPLANT
DRAPE TABLE BACK 44X90 PK DISP (DRAPES) ×6 IMPLANT
DRAPE UTILITY XL STRL (DRAPES) ×3 IMPLANT
DRAPE WARM FLUID 44X44 (DRAPE) ×3 IMPLANT
DRSG TEGADERM 4X4.75 (GAUZE/BANDAGES/DRESSINGS) ×3 IMPLANT
DRSG TEGADERM 6X8 (GAUZE/BANDAGES/DRESSINGS) ×6 IMPLANT
ELECT REM PT RETURN 9FT ADLT (ELECTROSURGICAL) ×3
ELECTRODE REM PT RTRN 9FT ADLT (ELECTROSURGICAL) ×2 IMPLANT
GLOVE BIO SURGEON STRL SZ 6.5 (GLOVE) ×6 IMPLANT
GLOVE BIO SURGEON STRL SZ7.5 (GLOVE) ×3 IMPLANT
GLOVE BIOGEL PI IND STRL 7.0 (GLOVE) ×4 IMPLANT
GLOVE BIOGEL PI INDICATOR 7.0 (GLOVE) ×2
GOWN PREVENTION PLUS XLARGE (GOWN DISPOSABLE) ×6 IMPLANT
GOWN STRL NON-REIN LRG LVL3 (GOWN DISPOSABLE) ×9 IMPLANT
HOLDER FOLEY CATH W/STRAP (MISCELLANEOUS) ×3 IMPLANT
KIT ACCESSORY DA VINCI DISP (KITS) ×1
KIT ACCESSORY DVNC DISP (KITS) ×2 IMPLANT
MANIPULATOR UTERINE 4.5 ZUMI (MISCELLANEOUS) ×3 IMPLANT
OCCLUDER COLPOPNEUMO (BALLOONS) ×3 IMPLANT
PACK LAPAROSCOPY W LONG (CUSTOM PROCEDURE TRAY) ×3 IMPLANT
POUCH SPECIMEN RETRIEVAL 10MM (ENDOMECHANICALS) ×6 IMPLANT
SET TUBE IRRIG SUCTION NO TIP (IRRIGATION / IRRIGATOR) ×3 IMPLANT
SHEET LAVH (DRAPES) ×3 IMPLANT
SOLUTION ELECTROLUBE (MISCELLANEOUS) ×3 IMPLANT
SPONGE LAP 18X18 X RAY DECT (DISPOSABLE) IMPLANT
STRIP CLOSURE SKIN 1/2X4 (GAUZE/BANDAGES/DRESSINGS) ×3 IMPLANT
SUT VIC AB 0 CT1 27 (SUTURE) ×1
SUT VIC AB 0 CT1 27XBRD ANTBC (SUTURE) ×2 IMPLANT
SUT VIC AB 4-0 PS2 27 (SUTURE) ×6 IMPLANT
SUT VICRYL 0 UR6 27IN ABS (SUTURE) ×3 IMPLANT
SYR 50ML LL SCALE MARK (SYRINGE) ×3 IMPLANT
SYR BULB IRRIGATION 50ML (SYRINGE) IMPLANT
TOWEL OR 17X26 10 PK STRL BLUE (TOWEL DISPOSABLE) ×6 IMPLANT
TRAP SPECIMEN MUCOUS 40CC (MISCELLANEOUS) IMPLANT
TRAY FOLEY CATH 14FRSI W/METER (CATHETERS) ×3 IMPLANT
TROCAR 12M 150ML BLUNT (TROCAR) ×3 IMPLANT
TROCAR XCEL 12X100 BLDLESS (ENDOMECHANICALS) ×3 IMPLANT
TROCAR XCEL BLADELESS 5X75MML (TROCAR) ×3 IMPLANT
WATER STERILE IRR 1500ML POUR (IV SOLUTION) ×6 IMPLANT

## 2013-03-17 NOTE — H&P (View-Only) (Signed)
Consult Note: Gyn-Onc  Olivia Sweeney 47 y.o. female  CC:  Chief Complaint  Patient presents with  . Endometrial Cancer    New patient    HPI: Patient is seen today in consultation at the request of Dr. Dareen Piano.  Patient is a 47 year old gravida 0 who in late 2013 began having heavy bleeding and clots. She went to see Dr. Dareen Piano her to establish yourself with GYN care. She had a Pap smear in January 23 that was negative. Should ultrasound revealed the uterus measuring 7.49 x 5.77 x 3.74 cm. The adnexa on the right was normal. She had several small centimeter fibroids. The left ovary was not well seen. The endometrium thickness was 15.5 mm. The patient subsequently underwent a hysteroscopy D&C and NovaSure ablation last week. Pathology from the [facility] revealed a grade 1 endometrioid adenocarcinoma arising within extensive complex hyperplasia with atypia.   The bleeding that she was having was very heavy and she states would bleed through her clothes. She had essentially a menstrual cycle for 3 months with heavy clots. Since her ablation she's had some slight discharge but no bleeding. She does have some right lower quadrant pain today. She denies a change in her bowel or bladder habits otherwise. She denies any chest pain, shortness of breath, nausea, vomiting, or fevers. She did have chills after her surgery but that has not persisted.   Current Meds:  Outpatient Encounter Prescriptions as of 02/25/2013  Medication Sig Dispense Refill  . oxyCODONE-acetaminophen (PERCOCET) 10-325 MG per tablet Take 1 tablet by mouth every 4 (four) hours as needed for pain.  30 tablet  0   No facility-administered encounter medications on file as of 02/25/2013.    Allergy:  Allergies  Allergen Reactions  . Bee Venom Shortness Of Breath and Swelling  . Other Other (See Comments)    Cats; Reaction: sneezing, etc    Social Hx:   History   Social History  . Marital Status: Divorced    Spouse Name:  N/A    Number of Children: N/A  . Years of Education: N/A   Occupational History  . Not on file.   Social History Main Topics  . Smoking status: Never Smoker   . Smokeless tobacco: Not on file  . Alcohol Use: 0.0 oz/week    4-8 drink(s) per week     Comment: 14-21  . Drug Use: No  . Sexually Active: Not on file   Other Topics Concern  . Not on file   Social History Narrative  . No narrative on file    Past Surgical Hx:  Past Surgical History  Procedure Laterality Date  . Abdominal exploration surgery      looking for fibroids  . Dental implant      also had soft tissue graft after dental implant   . Anterior cervical decomp/discectomy fusion  03/06/2012    Procedure: ANTERIOR CERVICAL DECOMPRESSION/DISCECTOMY FUSION 1 LEVEL;  Surgeon: Emilee Hero, MD;  Location: [facility] OR;  Service: Orthopedics;  Laterality: Left;  Anterior cervical decompression fusion, cervical 5-6, cervical 6-7 with instrumentation and allograft.  . Cataracts    . Hysteroscopy with novasure N/A 02/09/2013    Procedure: HYSTEROSCOPY WITH NOVASURE;  Surgeon: Levi Aland, MD;  Location: [facility] ORS;  Service: Gynecology;  Laterality: N/A;    Past Medical Hx:  Past Medical History  Diagnosis Date  . Diabetes mellitus without complication     diet controlled    Family Hx:  Family History  Problem Relation Age of Onset  . Breast cancer Mother   . Uterine cancer Mother   . Kidney cancer Mother   . Ovarian cancer Maternal Aunt   . Uterine cancer Maternal Grandmother   . Uterine cancer Other     Vitals:  Blood pressure 168/98, pulse 120, temperature 98.8 F (37.1 C), resp. rate 22, height 5\' 5"  (1.651 m), weight 128 lb (58.06 kg), last menstrual period 11/04/2012.  Physical Exam:  Well-nourished well-developed female in no acute distress.  Neck: Supple, no lymphadenopathy no thyromegaly.  Lungs: Clear to auscultation.  Cardiovascular: Regular rate and rhythm.  Abdomen: Soft, nontender,  nondistended. There is no palpable masses or hepatosplenomegaly.  Groins: No lymphadenopathy.  Extremities: No edema.  Pelvic: Normal external female genitalia. The cervix is visualized. There's no visible lesions. There's a thin [employer] discharge. Bimanual examination there is no cervical motion tenderness. The corpus is of normal size shape and consistency. There are no adnexal masses.  Assessment/Plan: 47 year old with a clinical stage I grade 1 endometrial carcinoma status post D&C NovaSure ablation. The diagnosis was made on the pathology from her D&C. I discussed with her that we would need to proceed with definitive surgical management. Proceed with a hysterectomy which would include removal of the cervix and bilateral salpingectomy and oophorectomy. We discussed different route of surgery I believe the patient would be a candidate for a minimally invasive surgery.  Surgery is tentatively scheduled for May 20. We will alert anesthesia to the amount of alcohol the patient drinks daily so that they can be aware and we will cover her in the postoperative period as well. I discussed with her that we would send the uterus for frozen section and that there may be some limitations to this based on the endometrial ablation. However, we will do a pelvic lymphadenectomy and pending the results of the frozen section we may do para-aortic lymphadenectomy.  Risks of surgery including but not limited to bleeding, infection, injury to surrounding organs and thromboembolic disease were discussed with the patient. She has a close friend was is an OB/GYN in [place] and we answered the questions that her friend had asked her to discuss with me. She also has a friend "Leta Jungling" who accompanied her today and her questions were similarly answered. The patient will be traveling to see her father in [place] the week prior surgery.  So her preop appt. be scheduled for next week. Again, her questions were elicited and  answered to her satisfaction. She'll call me with any questions prior to her date of surgery.  Cleda Mccreedy A., MD 02/25/2013, 4:54 PM

## 2013-03-17 NOTE — Anesthesia Postprocedure Evaluation (Signed)
  Anesthesia Post-op Note  Patient: Olivia Sweeney  Procedure(s) Performed: Procedure(s) (LRB): ROBOTIC ASSISTED TOTAL HYSTERECTOMY WITH BILATERAL SALPINGO OOPHORECTOMY (N/A) LYMPH NODE DISSECTION (Bilateral)  Patient Location: PACU  Anesthesia Type: General  Level of Consciousness: awake and alert   Airway and Oxygen Therapy: Patient Spontanous Breathing  Post-op Pain: mild  Post-op Assessment: Post-op Vital signs reviewed, Patient's Cardiovascular Status Stable, Respiratory Function Stable, Patent Airway and No signs of Nausea or vomiting  Last Vitals:  Filed Vitals:   03/17/13 1200  BP: 134/68  Pulse: 97  Temp: 36.8 C  Resp: 20    Post-op Vital Signs: stable   Complications: No apparent anesthesia complications

## 2013-03-17 NOTE — Op Note (Signed)
PATIENT: Olivia Sweeney DATE OF BIRTH: Sep 28, 1966 ENCOUNTER DATE: 03/17/2013   Preop Diagnosis: Grade 1 endometrioid adenocarcinoma s/p D&C and endometrial ablation  Postoperative Diagnosis: same.   Surgery: Total robotic hysterectomy bilateral salpingo-oophorectomy, bilateral pelvic lymph node dissection.   Surgeons:  Bernita Buffy. Duard Brady, MD; Antionette Char, MD   Assistant: Telford Nab   Anesthesia: General   Estimated blood loss: 20 ml   IVF: 1000 ml   Urine output:  200 ml   Complications: None   Pathology: Uterus, cervix, bilateral tubes and ovaries, bilateral pelvic lymph nodes to pathology.   Operative findings:  Frozen section revealed no residual grade 1 endometrial adenocarcinoma, evidence of endometrial ablation, no myometrial invasion.   Procedure: The patient was identified in the preoperative holding area. Informed consent was signed on the chart. Patient was seen history was reviewed and exam was performed.   The patient was then taken to the operating room and placed in the supine position with SCD hose on. She was then placed in the dorsolithotomy position. Her arms were tucked at her side with appropriate precautions on the gel pad. General anesthesia was then induced without difficulty. Shoulder blocks were then placed in the usual fashion with appropriate precautions. A OG-tube was placed to suction. First timeout was performed to confirm the patient, procedure, antibiotic, allergy status, estimated blood loss and OR time. The perineum was then prepped in the usual fashion with Betadine. A 14 French Foley was inserted into the bladder under sterile conditions. A sterile speculum was placed in the vagina. The cervix was without lesions. The cervix was grasped with a single-tooth tenaculum. The dilator without difficulty. A ZUMI with a medium Koe ring was placed without difficulty. The abdomen was then prepped with 1 Chlor prep sponges per protocol.   Patient  was then draped after the prep was dried. Second timeout was performed to confirm the above. After again confirming OG tube placement and it was to suction. A stab-wound was made in left upper quadrant 2 cm below the costal margin on the left in the midclavicular line. A 5 mm operative report was used to assure intra-abdominal placement. The abdomen was insufflated. At this point all points during the procedure the patient's intra-abdominal pressure was not increased over 15 mm of mercury. After insufflation was complete, the patient was placed in deep Trendelenburg position. 25 cm above the pubic symphysis that area was marked the camera port. Bilateral robotic ports were marked 10 cm from the midline incision at approximately 5 angle. Under direct visualization each of the trochars was placed into the abdomen. The small bowel was folded on its mesentery to allow visualization to the pelvis. The 5 mm LUQ port was then converted to a 10/12 port under direct visualization.  After assuring adequate visualization, the robot was then docked in the usual fashion. Under direct visualization the robotic instruments replaced.   The round ligament on the patient's right side was transected with monopolar cautery. The anterior and posterior leaves of the broad ligament were then taken down in the usual fashion. The ureter was identified on the medial leaf of the broad ligament. A window was made between [facility] and the ureter. The IP was coagulated with bipolar cautery and transected. The posterior leaf of the broad ligament was taken down to the level of the KOH ring. The bladder flap was created using meticulous dissection and pinpoint cautery. The uterine vessels were coagulated with bipolar cautery. The uterine vessels were then transected and  the C loop was created. The same procedure was performed on the patient's left side.   The pneumo-occulder in the vagina was then insufflated. The colpotomy was then created in the  usual fashion. The specimen was then delivered to the vagina and sent for frozen section. Our attention was then drawn to opening the paravesical space on her right side the perirectal space was also opened. The obturator nerve was identified. The nodal bundle extending over the external iliac artery down to the external iliac vein was taken down using sharp dissection and monopolar cautery. The genitofemoral nerve was identified and spared. We continued our dissection down to the level of the obturator nerve. The nodal bundle superior to the obturator nerve was taken. All pedicles were noted to be hemostatic the ureter was noted to be well medial of the area of dissection. The nodal bundle was then placed and an Endo catch bag. The same procedure was performed on the left side except that prior to removing the obturator nodes, the frozen section returned showing no residual cancer and no myometrial invasion. The decision was made to not proceed with any additional lymphadenectomy.  All specimens were delivered to the vagina. The vaginal cuff was closed with a running 0 Vicryl on CT 1 suture. The abdomen and pelvis were copiously irrigated and noted to be hemostatic. The robotic instruments were removed under direct visualization as were the robotic trochars. The pneumoperitoneum was removed and she was given several deep breaths by anesthesia. The patient was then taken out of the Trendelenburg position. Using of 0 Vicryl on a UR 6 needle the midline port fascia was closed with a figure of eight suture, the subcutaneous tissues of the port site in the left upper quadrant ports were reapproximated. The skin was closed using 4-0 Vicryl. Steri-Strips and benzoin were applied. The pneumo occluded balloon was removed from the vagina. The vagina was swabbed and noted to be hemostatic.   All instrument needle and Ray-Tec counts were correct x2. The patient tolerated the procedure well and was taken to the recovery  room in stable condition. This is Cleda Mccreedy dictating an operative note on patient Olivia Sweeney.

## 2013-03-17 NOTE — Transfer of Care (Signed)
Immediate Anesthesia Transfer of Care Note  Patient: Olivia Sweeney  Procedure(s) Performed: Procedure(s) (LRB): ROBOTIC ASSISTED TOTAL HYSTERECTOMY WITH BILATERAL SALPINGO OOPHORECTOMY (N/A) LYMPH NODE DISSECTION (Bilateral)  Patient Location: PACU  Anesthesia Type: General  Level of Consciousness: sedated, patient cooperative and responds to stimulaton  Airway & Oxygen Therapy: Patient Spontanous Breathing and Patient connected to face mask oxgen  Post-op Assessment: Report given to PACU RN and Post -op Vital signs reviewed and stable  Post vital signs: Reviewed and stable  Complications: No apparent anesthesia complications

## 2013-03-17 NOTE — Anesthesia Preprocedure Evaluation (Addendum)
Anesthesia Evaluation  Patient identified by MRN, date of birth, ID band Patient awake    Reviewed: Allergy & Precautions, H&P , NPO status , Patient's Chart, lab work & pertinent test results  Airway Mallampati: II TM Distance: >3 FB Neck ROM: Full    Dental no notable dental hx.    Pulmonary neg pulmonary ROS,  breath sounds clear to auscultation  Pulmonary exam normal       Cardiovascular negative cardio ROS  Rhythm:Regular Rate:Normal     Neuro/Psych negative neurological ROS  negative psych ROS   GI/Hepatic negative GI ROS, Neg liver ROS,   Endo/Other  negative endocrine ROSdiabetes  Renal/GU negative Renal ROS  negative genitourinary   Musculoskeletal negative musculoskeletal ROS (+)   Abdominal   Peds negative pediatric ROS (+)  Hematology negative hematology ROS (+)   Anesthesia Other Findings   Reproductive/Obstetrics negative OB ROS                          Anesthesia Physical Anesthesia Plan  ASA: II  Anesthesia Plan: General   Post-op Pain Management:    Induction: Intravenous  Airway Management Planned: Oral ETT  Additional Equipment:   Intra-op Plan:   Post-operative Plan: Extubation in OR  Informed Consent: I have reviewed the patients History and Physical, chart, labs and discussed the procedure including the risks, benefits and alternatives for the proposed anesthesia with the patient or authorized representative who has indicated his/her understanding and acceptance.   Dental advisory given  Plan Discussed with: CRNA  Anesthesia Plan Comments:         Anesthesia Quick Evaluation

## 2013-03-17 NOTE — Interval H&P Note (Signed)
History and Physical Interval Note:  03/17/2013 7:08 AM  Olivia Sweeney  has presented today for surgery, with the diagnosis of ENDOMETRIAL CANCER  The various methods of treatment have been discussed with the patient and family. After consideration of risks, benefits and other options for treatment, the patient has consented to  Procedure(s): ROBOTIC ASSISTED TOTAL HYSTERECTOMY WITH BILATERAL SALPINGO OOPHORECTOMY (N/A) LYMPH NODE DISSECTION (N/A) as a surgical intervention .  The patient's history has been reviewed, patient examined, no change in status, stable for surgery.  I have reviewed the patient's chart and labs.  Questions were answered to the patient's satisfaction.     Olivia Sweeney A.

## 2013-03-18 ENCOUNTER — Telehealth: Payer: Self-pay | Admitting: Gynecologic Oncology

## 2013-03-18 ENCOUNTER — Telehealth: Payer: Self-pay | Admitting: *Deleted

## 2013-03-18 ENCOUNTER — Encounter (HOSPITAL_COMMUNITY): Payer: Self-pay | Admitting: Gynecologic Oncology

## 2013-03-18 LAB — CBC
HCT: 33 % — ABNORMAL LOW (ref 36.0–46.0)
MCV: 94 fL (ref 78.0–100.0)
RBC: 3.51 MIL/uL — ABNORMAL LOW (ref 3.87–5.11)
WBC: 9.6 10*3/uL (ref 4.0–10.5)

## 2013-03-18 LAB — BASIC METABOLIC PANEL
CO2: 25 mEq/L (ref 19–32)
Chloride: 99 mEq/L (ref 96–112)
Sodium: 131 mEq/L — ABNORMAL LOW (ref 135–145)

## 2013-03-18 LAB — GLUCOSE, CAPILLARY: Glucose-Capillary: 170 mg/dL — ABNORMAL HIGH (ref 70–99)

## 2013-03-18 MED ORDER — OXYCODONE-ACETAMINOPHEN 5-325 MG PO TABS: 1.0000 | ORAL_TABLET | ORAL | Status: DC | PRN

## 2013-03-18 NOTE — Progress Notes (Signed)
Patient discharged home in stable condition.  No change from morning assessment.  Instructions and script given to pt with verbal understanding and feedback.  No further questions or concerns at this time.

## 2013-03-18 NOTE — Telephone Encounter (Signed)
Patient notified of Path results.  F/U appt 03/31/13 @ 1045

## 2013-03-18 NOTE — Discharge Summary (Signed)
Physician Discharge Summary  Patient ID: Olivia Sweeney MRN: [id number] DOB/AGE: [id number] 49 y.o.  Admit date: 03/17/2013 Discharge date: 03/18/2013  Admission Diagnoses: Endometrial ca  Discharge Diagnoses:  Principal Problem:   Endometrial ca  Discharged Condition:  The patient is in good condition and stable for discharge.  Hospital Course:  On 03/17/2013, the patient underwent the following: Procedure(s):  ROBOTIC ASSISTED TOTAL HYSTERECTOMY WITH BILATERAL SALPINGO OOPHORECTOMY LYMPH NODE DISSECTION.  The postoperative course was uneventful.  She was discharged to home on postoperative day 1 tolerating a regular diet.  Consults: None  Significant Diagnostic Studies: None  Treatments: surgery: see above  Discharge Exam: Blood pressure 92/61, pulse 93, temperature 98.6 F (37 C), temperature source Oral, resp. rate 18, height 5\' 5"  (1.651 m), weight 124 lb 12.8 oz (56.609 kg), last menstrual period 02/15/2013, SpO2 97.00%. General appearance: alert, cooperative and no distress Resp: clear to auscultation bilaterally Cardio: regular rate and rhythm, S1, S2 normal, no murmur, click, rub or gallop GI: soft, non-tender; bowel sounds normal; no masses,  no organomegaly Extremities: extremities normal, atraumatic, no cyanosis or edema Incision/Wound: Lap sites to abdomen with steri strips clean, dry, and intact  Disposition: 01-Home or Self Care  Discharge Orders   Future Orders Complete By Expires     Call MD for:  difficulty breathing, headache or visual disturbances  As directed     Call MD for:  extreme fatigue  As directed     Call MD for:  hives  As directed     Call MD for:  persistant dizziness or light-headedness  As directed     Call MD for:  persistant nausea and vomiting  As directed     Call MD for:  redness, tenderness, or signs of infection (pain, swelling, redness, odor or green/yellow discharge around incision site)  As directed     Call MD for:  severe  uncontrolled pain  As directed     Call MD for:  temperature >100.4  As directed     Diet - low sodium heart healthy  As directed     Driving Restrictions  As directed     Comments:      No driving for 2 weeks.  Do not take narcotics and drive.    Increase activity slowly  As directed     Lifting restrictions  As directed     Comments:      No lifting greater than 10 lbs.    Sexual Activity Restrictions  As directed     Comments:      No sexual activity, nothing in the vagina, for 8 weeks.        Medication List    TAKE these medications       oxyCODONE-acetaminophen 5-325 MG per tablet  Commonly known as:  PERCOCET/ROXICET  Take 1-2 tablets by mouth every 4 (four) hours as needed (severe pain).         Signed: Emaly Boschert DEAL 03/18/2013, 9:59 AM

## 2013-03-18 NOTE — Telephone Encounter (Signed)
Message left for the patient about her followup appointment.  She was informed that her followup appointment will be on July 17 for an 8 week postop check. She was instructed to call back if date or time would not work for her.

## 2013-03-31 ENCOUNTER — Encounter: Payer: Self-pay | Admitting: Gynecologic Oncology

## 2013-03-31 ENCOUNTER — Ambulatory Visit: Payer: 59 | Attending: Gynecology | Admitting: Gynecologic Oncology

## 2013-03-31 VITALS — BP 142/80 | HR 60 | Temp 98.6°F | Resp 16 | Ht 65.0 in | Wt 130.2 lb

## 2013-03-31 DIAGNOSIS — Z9079 Acquired absence of other genital organ(s): Secondary | ICD-10-CM | POA: Insufficient documentation

## 2013-03-31 DIAGNOSIS — Z981 Arthrodesis status: Secondary | ICD-10-CM | POA: Insufficient documentation

## 2013-03-31 DIAGNOSIS — C549 Malignant neoplasm of corpus uteri, unspecified: Secondary | ICD-10-CM | POA: Insufficient documentation

## 2013-03-31 DIAGNOSIS — Z90710 Acquired absence of both cervix and uterus: Secondary | ICD-10-CM | POA: Insufficient documentation

## 2013-03-31 DIAGNOSIS — E119 Type 2 diabetes mellitus without complications: Secondary | ICD-10-CM | POA: Insufficient documentation

## 2013-03-31 DIAGNOSIS — C541 Malignant neoplasm of endometrium: Secondary | ICD-10-CM

## 2013-03-31 NOTE — Patient Instructions (Addendum)
Doing well.  You may return to work.  Avoid heavy lifting.  Plan to follow up with Dr. Nelly Rout as scheduled.  Please call for any questions or concerns.

## 2013-03-31 NOTE — Progress Notes (Addendum)
Follow Up Note: Gyn-Onc  Olivia Sweeney 47 y.o. female  CC:  Chief Complaint  Patient presents with  . Endometrial Cancer    Follow up    HPI:  Olivia Sweeney is a 47 year old, gravida 0, who was referred by Dr. Dareen Piano for grade 1 endometrioid adenocarcinoma.  She began experiencing heavy bleeding and clots in late 2013. She went to see Dr. Dareen Piano her to establish yourself with GYN care. She had a Pap smear on January 23 that was negative.  Ultrasound revealed the uterus measuring 7.49 x 5.77 x 3.74 cm and the adnexa on the right was normal. She had several small centimeter fibroids. The left ovary was not well seen. The endometrium thickness was 15.5 mm. The patient subsequently underwent a hysteroscopy D&C and NovaSure ablation in April 2014 with pathology revealing a grade 1 endometrioid adenocarcinoma arising within extensive complex hyperplasia with atypia.  On 03/17/13, she underwent a total robotic hysterectomy, bilateral salpingo-oophorectomy, and bilateral pelvic lymph node dissection by Dr. Cleda Mccreedy.  Final pathology revealed: 1. Uterus and cervix, with bilateral fallopian tubes and ovaries - MULTIPLE FOCI OF RESIDUAL ENDOMETRIOID CARCINOMA, FIGO GRADE I, ARISING IN A BACKGROUND OF ATYPICAL COMPLEX HYPERPLASIA, SUPERFICIALLY INVADING INTO MYOMETRIUM. - EXTENSIVE FIBROSIS AND FIBRIN DEPOSITS. - LEIOMYOMA AND ADENOMYOSIS. - CERVIX: BENIGN SQUAMOUS MUCOSA AND ENDOCERVICAL MUCOSA, NO DYSPLASIA OR MALIGNANCY. - BILATERAL OVARIES AND FALLOPIAN TUBES: BENIGN OVARIAN AND FALLOPIAN TUBAL TISSUE, NO EVIDENCE OF ATYPIA OR MALIGNANCY. 2. Lymph nodes, regional resection, right pelvic - FIVE LYMPH NODES, NO EVIDENCE OF MALIGNANCY (0/5). 3. Lymph nodes, regional resection, left pelvic - THREE LYMPH NODES, NEGATIVE FOR MALIGNANCY (0/3)  Interval History:  She presents today for post-operative follow up.   Her post-operative course was uneventful.  She reports doing well and would like to return to work tomorrow.  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  She reports that her job is very flexible and she would be able to work part-time if needed.  She states that she stayed with her ex-husband for the past two weeks for recovery and is glad to be back at home now with access to her vehicle.  She reports minimal pain with no use of medications.  She is tolerating a regular diet and reports no change in appetite.  She is ambulating without difficulty.  She reports adequate bladder and bowel function.  She had one episode of light pink vaginal spotting last week that resolved after two days.  No other concerns voiced.     Review of Systems  Constitutional: Feels well.  No fever, chills, change in appetite, weight loss/gain. Cardiovascular: No chest pain, shortness of breath, or edema.  Pulmonary: No cough or wheeze.  Gastrointestinal: No nausea, vomiting, or diarrhea. No bright red blood per rectum or change in bowel movement.  No abdominal pain.  Genitourinary: No frequency, urgency, or dysuria. No vaginal bleeding or discharge.  Musculoskeletal: No myalgia or joint pain. Neurologic: No weakness, numbness, or change in gait.  Psychology: No depression, anxiety, or insomnia.  Current Meds:  Outpatient Encounter Prescriptions as of 03/31/2013  Medication Sig Dispense Refill  . oxyCODONE-acetaminophen (PERCOCET/ROXICET) 5-325 MG per tablet Take 1-2 tablets by mouth every 4 (four) hours as needed (severe pain).  30 tablet  0   No facility-administered encounter medications on file as of 03/31/2013.    Allergy:  Allergies  Allergen Reactions  . Bee Venom Shortness Of Breath  and Swelling  . Other Other (See Comments)    Cats; Reaction: sneezing, etc    Social Hx:   History   Social History  . Marital Status: Divorced    Spouse Name: N/A     Number of Children: N/A  . Years of Education: N/A   Occupational History  . Not on file.   Social History Main Topics  . Smoking status: Never Smoker   . Smokeless tobacco: Never Used  . Alcohol Use: 0.0 oz/week    4-8 drink(s) per week     Comment: 14-21  . Drug Use: No  . Sexually Active: Not on file   Other Topics Concern  . Not on file   Social History Narrative  . No narrative on file    Past Surgical Hx:  Past Surgical History  Procedure Laterality Date  . Abdominal exploration surgery      looking for fibroids  . Dental implant      also had soft tissue graft after dental implant   . Anterior cervical decomp/discectomy fusion  03/06/2012    Procedure: ANTERIOR CERVICAL DECOMPRESSION/DISCECTOMY FUSION 1 LEVEL;  Surgeon: Emilee Hero, MD;  Location: [facility] OR;  Service: Orthopedics;  Laterality: Left;  Anterior cervical decompression fusion, cervical 5-6, cervical 6-7 with instrumentation and allograft.  . Cataracts    . Hysteroscopy with novasure N/A 02/09/2013    Procedure: HYSTEROSCOPY WITH NOVASURE;  Surgeon: Levi Aland, MD;  Location: [facility] ORS;  Service: Gynecology;  Laterality: N/A;  . Robotic assisted total hysterectomy with bilateral salpingo oopherectomy N/A 03/17/2013    Procedure: ROBOTIC ASSISTED TOTAL HYSTERECTOMY WITH BILATERAL SALPINGO OOPHORECTOMY;  Surgeon: Rejeana Brock A. Duard Brady, MD;  Location: [facility] ORS;  Service: Gynecology;  Laterality: N/A;  . Lymph node dissection Bilateral 03/17/2013    Procedure: LYMPH NODE DISSECTION;  Surgeon: Rejeana Brock A. Duard Brady, MD;  Location: [facility] ORS;  Service: Gynecology;  Laterality: Bilateral;    Past Medical Hx:  Past Medical History  Diagnosis Date  . Diabetes mellitus without complication     diet controlled    Family Hx:  Family History  Problem Relation Age of Onset  . Breast cancer Mother   . Uterine cancer Mother   . Kidney cancer Mother   . Ovarian cancer Maternal Aunt   . Uterine cancer Maternal Grandmother    . Uterine cancer Other     Vitals:  Blood pressure 142/80, pulse 60, temperature 98.6 F (37 C), temperature source Oral, resp. rate 16, height 5\' 5"  (1.651 m), weight 130 lb 3.2 oz (59.058 kg), last menstrual period 02/16/2013.  Physical Exam:  General: Well developed, well nourished female in no acute distress. Alert and oriented x 3.    Cardiovascular: Regular rate and rhythm. S1 and S2 normal.  Lungs: Clear to auscultation bilaterally. No wheezes/crackles/rhonchi noted.  Skin: No rashes or lesions present. Back: No CVA tenderness.  Abdomen: Abdomen soft, non-tender and non-obese. Active bowel sounds in all quadrants. Lap sites well healed with no evidence of herniation.  Extremities: No bilateral cyanosis, edema, or clubbing.   Assessment/Plan:  Belky Mundo is a 47 year old with a stage IA grade 1 endometrioid carcinoma status post a total robotic hysterectomy, bilateral salpingo-oophorectomy, and bilateral pelvic lymph node dissection on 03/17/13.  She is doing well post-operatively.  She is advised that she may return to work as tolerated and to continue to avoid heavy lifting and straining.  She is to follow up with Dr. Nelly Rout on May 14, 2013  for a post-operative check including a pelvic examination or sooner if needed.  Survivorship packet reviewed with reportable signs and symptoms, recommended preventative screenings per the Society of Gynecologic Oncology, and support services offered through the [facility].  She is advised to call for any questions or concerns.    CROSS, MELISSA DEAL, NP 03/31/2013, 12:49 PM

## 2013-04-30 IMAGING — CR DG CHEST 2V
2 series · 2 of 2 positions shown · non-contrast
Comparison: None.

CLINICAL DATA: Preop for anterior cervical decompression/discectomy
and fusion.  No chest complaints.

CHEST - 2 VIEW

[view not recorded (1 of 2)]
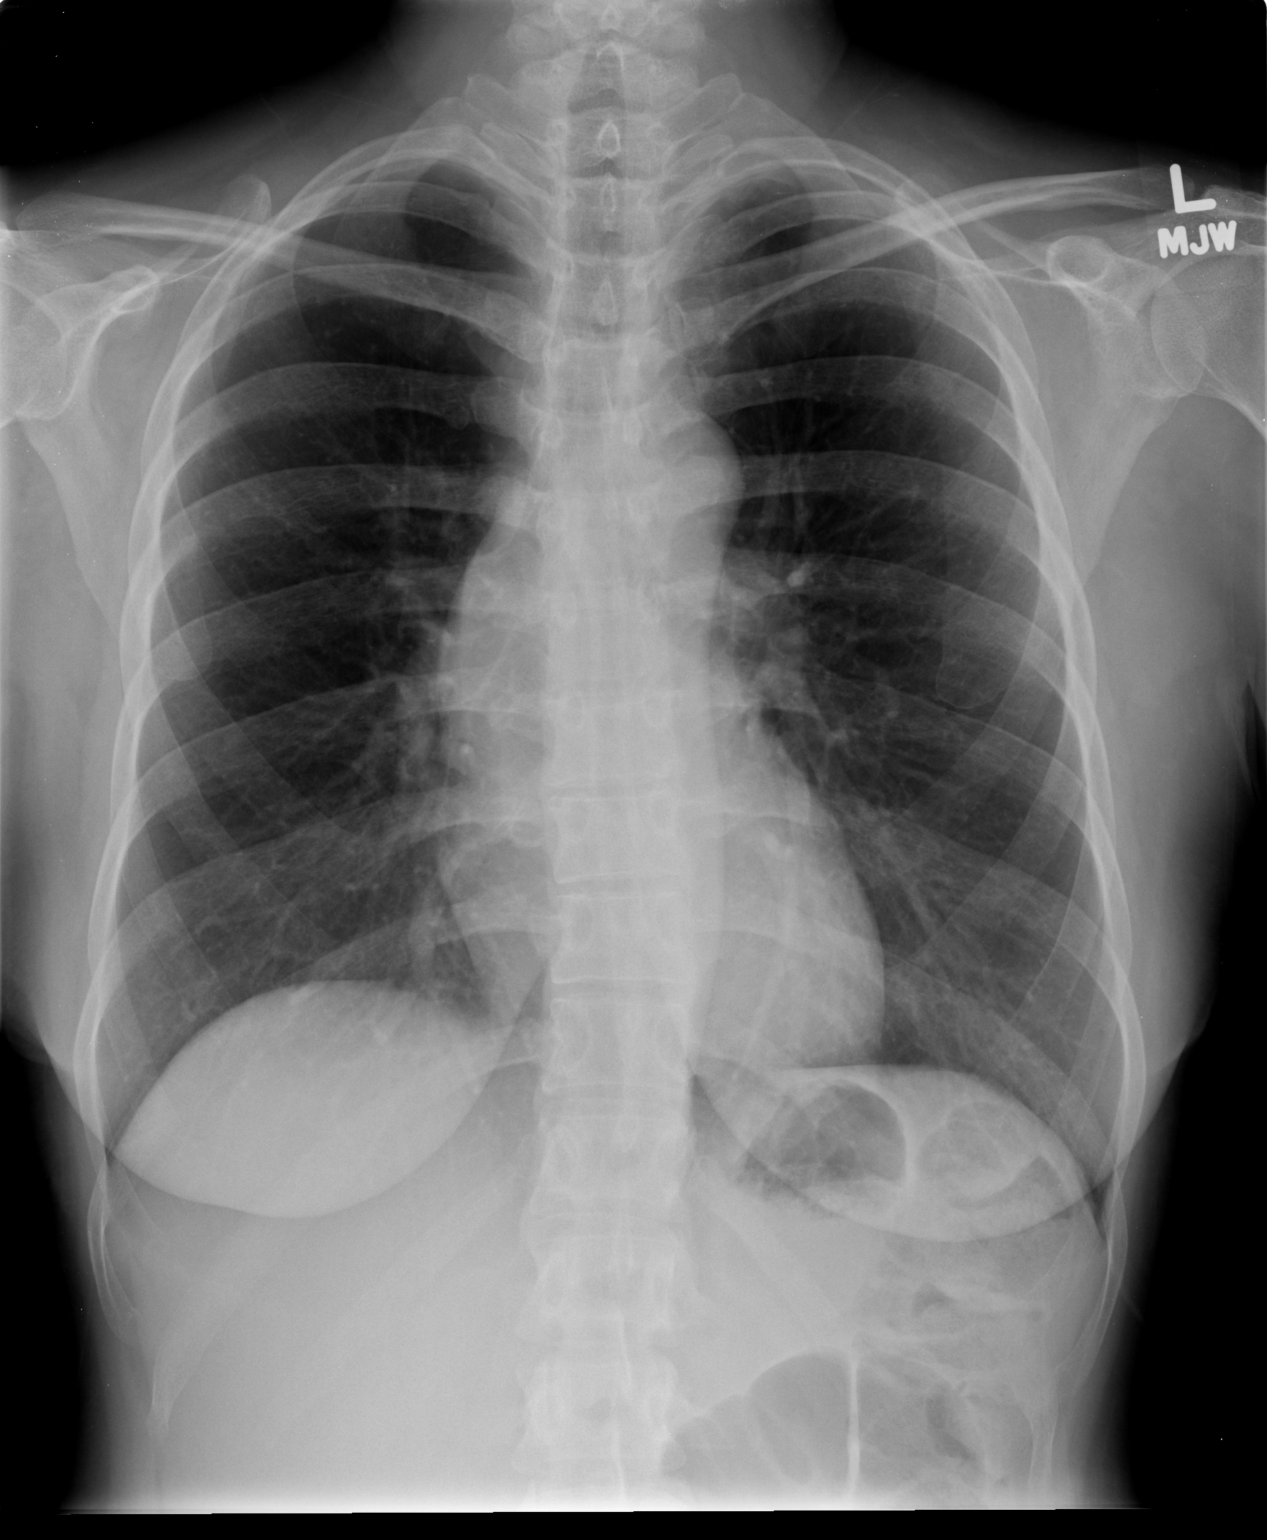

[view not recorded (2 of 2)]
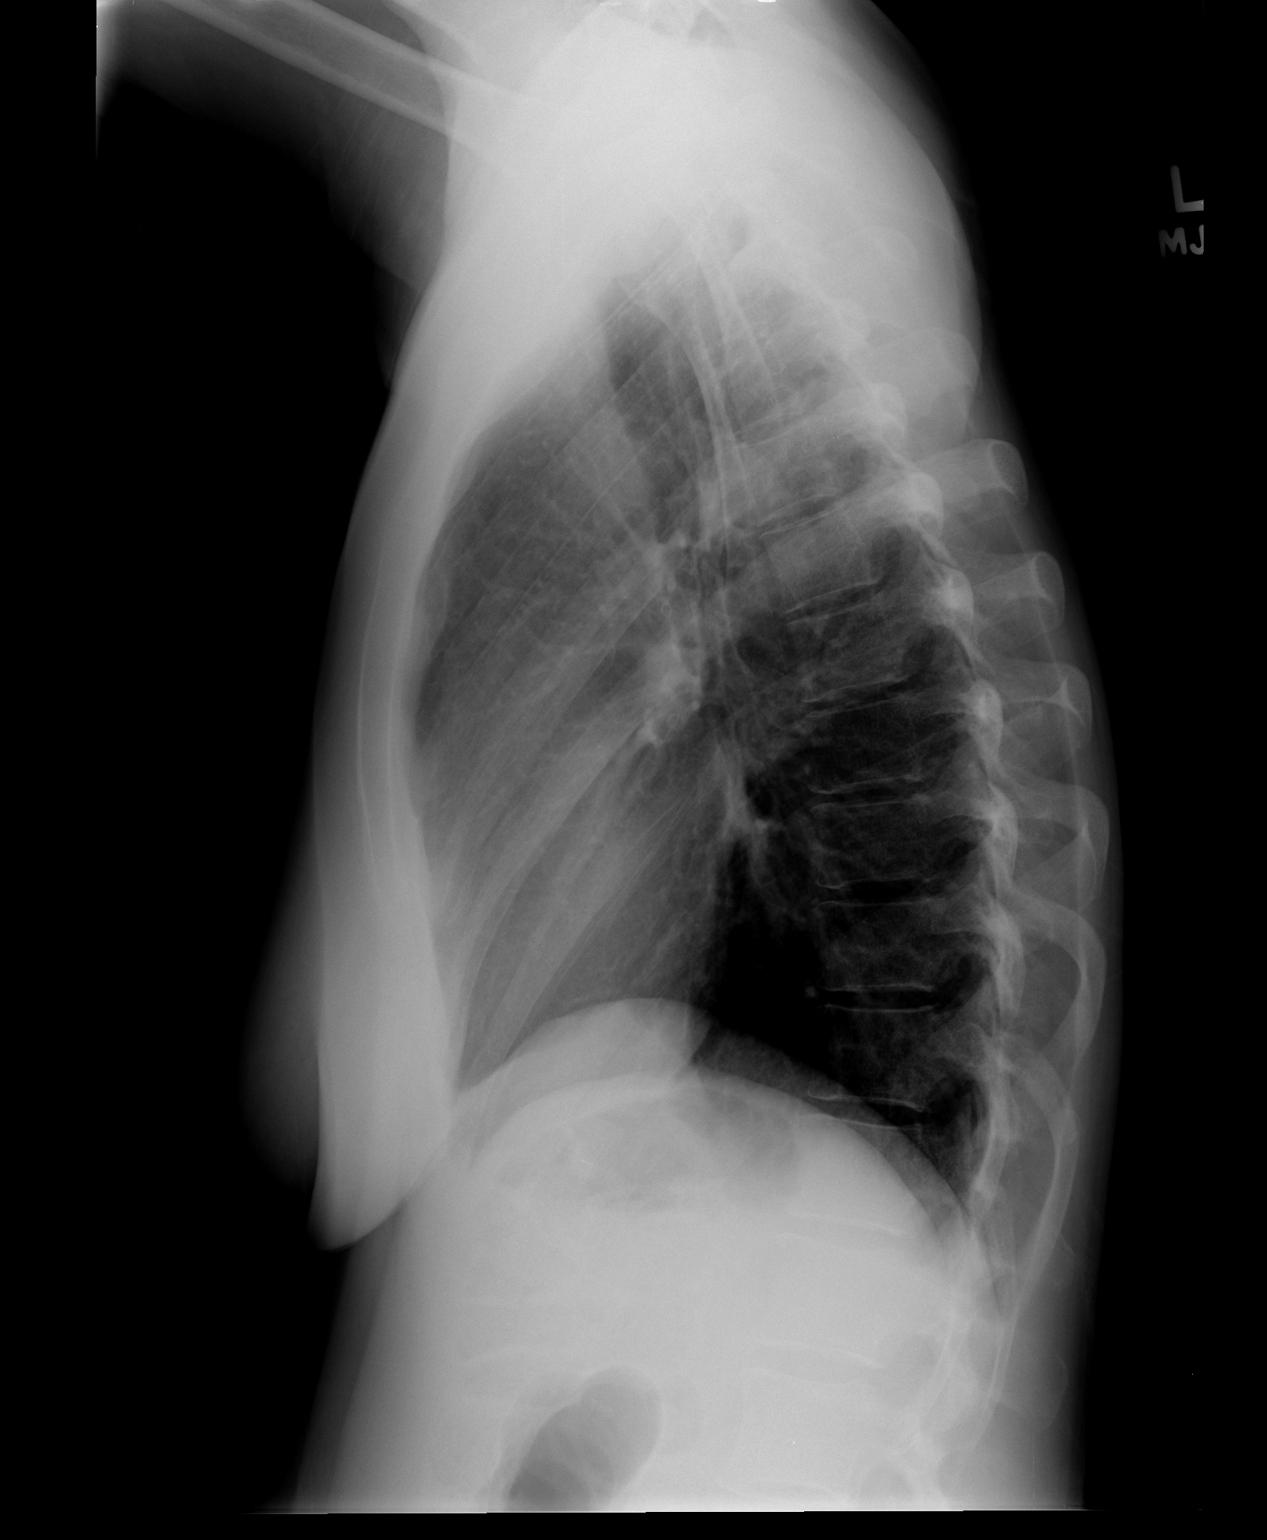

[2 of 2 positions shown; findings below may reference images not displayed]

FINDINGS: Heart, mediastinal, and hilar contours are within normal
limits.  The lungs are well expanded and clear.  There is no
pleural effusion.  Bony thorax is unremarkable.  Visualized upper
abdomen within normal limits.
IMPRESSION: No acute cardiopulmonary disease.

## 2013-05-13 ENCOUNTER — Telehealth: Payer: Self-pay | Admitting: Gynecologic Oncology

## 2013-05-13 NOTE — Telephone Encounter (Signed)
Office Visit:  GYN ONCOLOGY  Chief Complaint:   Post operative check.  Endometrial cancer  Assessment:    47 y.o. year old with Stage IA Grade 1 endometrioid endometrial cancer.   S/p RTLH BSO BPLND on 03/17/2013. No  LVSI, superficial  myometrial invasion, negative  lymph nodes.   Plan: 1) Pathology reports reviewed today 2) Treatment counseling - Very low risk (<5%) for recurrence given age, grade, depth of myometrial invasion and LVSI status. Multidisciplinary tumor board recommendation is for routine surveillance with frequent pelvic exams and visits with annual pap smear.  We will start with visits every 6 months x 2 years, then every 6 months for 3 more years, at which time she can return to annual visits.  Discussed signs and symptoms of recurrence including vaginal bleeding or discharge, leg pain or swelling and changes in bowel or bladder habits. She was given the opportunity to ask questions, which were answered to her satisfaction, and she is agreement with the above mentioned plan of care.  3)  Return to clinic in 6 months F/Uwith Dr.XX in 12 months   HPI:  Olivia Sweeney is a 47 y.o. year old  She then underwent a Robtic hysterectom BSO BPLND on 02/2013 by Dr. Duard Brady  without complications.  Her postoperative course was uncomplicated  Her final pathologic diagnosis is a Stage IA Grade 1 endometrioid endometrial cancer with no lymphovascular space invasion, 3/16 mm of myometrial invasion and negative lymph nodes.  She is seen today for a postoperative check and to discuss her pathology results and treatment plan.  Review of systems: Constitutional:  She has no weight gain or weight loss. She has no fever or chills. Eyes: No blurred vision Ears, Nose, Mouth, Throat: No dizziness, headaches or changes in hearing. No mouth sores. Cardiovascular: No chest pain, palpitations or edema. Respiratory:  No shortness of breath, wheezing or cough Gastrointestinal: She has normal bowel  movements without diarrhea or constipation. She denies any nausea or vomiting. She denies blood in her stool or heart burn. Genitourinary:  She denies pelvic pain, pelvic pressure or changes in her urinary function. She has no hematuria, dysuria, or incontinence. She has no irregular vaginal bleeding or vaginal discharge Musculoskeletal: Denies muscle weakness or joint pains.  Skin:  She has no skin changes, rashes or itching Neurological:  Denies dizziness or headaches. No neuropathy, no numbness or tingling. Psychiatric:  She denies depression or anxiety. Hematologic/Lymphatic:   No easy bruising or bleeding   Physical Exam: @v @ General: Well dressed, well nourished in no apparent distress.   HEENT:  Normocephalic and atraumatic, no lesions.  Extraocular muscles intact. Sclerae anicteric. Pupils equal, round, reactive. No mouth sores or ulcers. Thyroid is normal size, not nodular, midline. Skin:  No lesions or rashes. Breasts:  Soft, symmetric.  No skin or nipple changes.  No palpable LN or masses. Lungs:  Clear to auscultation bilaterally.  No wheezes. Cardiovascular:  Regular rate and rhythm.  No murmurs or rubs. Abdomen:  Soft, nontender, nondistended.  No palpable masses.  No hepatosplenomegaly.  No ascites. Normal bowel sounds.  No hernias.  Incision is XXX Genitourinary: Normal EGBUS  Vaginal cuff intact.  No bleeding or discharge.  No cul de sac fullness. Extremities: No cyanosis, clubbing or edema.  No calf tenderness or erythema. No palpable cords. Psychiatric: Mood and affect are appropriate. Neurological: Awake, alert and oriented x 3. Sensation is intact, no neuropathy.  Musculoskeletal: No pain, normal strength and range of motion.

## 2013-05-14 ENCOUNTER — Encounter: Payer: Self-pay | Admitting: Gynecologic Oncology

## 2013-05-14 ENCOUNTER — Ambulatory Visit: Payer: 59 | Attending: Gynecologic Oncology | Admitting: Gynecologic Oncology

## 2013-05-14 VITALS — BP 140/80 | HR 60 | Temp 98.2°F | Resp 16 | Ht 65.0 in | Wt 128.4 lb

## 2013-05-14 DIAGNOSIS — Z9079 Acquired absence of other genital organ(s): Secondary | ICD-10-CM | POA: Insufficient documentation

## 2013-05-14 DIAGNOSIS — E8941 Symptomatic postprocedural ovarian failure: Secondary | ICD-10-CM | POA: Insufficient documentation

## 2013-05-14 DIAGNOSIS — C541 Malignant neoplasm of endometrium: Secondary | ICD-10-CM

## 2013-05-14 DIAGNOSIS — C549 Malignant neoplasm of corpus uteri, unspecified: Secondary | ICD-10-CM | POA: Insufficient documentation

## 2013-05-14 NOTE — Progress Notes (Signed)
Office Visit:  GYN ONCOLOGY  Chief Complaint:   Post operative check.  Endometrial cancer  Assessment:   47 y.o.  with Stage IA Grade 1 endometrioid endometrial cancer.   S/p RTLH BSO BPLND on 03/17/2013. No  LVSI, superficial  myometrial invasion, negative  lymph nodes.   Plan: 1) Pathology reports reviewed today 2) Treatment counseling - Very low risk (<5%) for recurrence given age, grade, depth of myometrial invasion and LVSI status. Multidisciplinary tumor board recommendation is for routine surveillance with frequent pelvic exams and visits with annual pap smear.  We will start with visits every 6 months x 2 years, then every 6 months for 3 more years, at which time she can return to annual visits.  Discussed signs and symptoms of recurrence including vaginal bleeding or discharge, leg pain or swelling and changes in bowel or bladder habits. She was given the opportunity to ask questions, which were answered to her satisfaction, and she is agreement with the above mentioned plan of care.  3)  Return to clinic in one year 4) F/Uwith Dr. Dareen Piano in 6 months  5) Family history is notable for mother with both uterine and kidney cancer. A request was made for IHC stains of the tumor specimen. If IHC is positive the patient will be referred to genetic counseling she is aware of this plan 6)  the risks and benefits of the use of Megace effects or or clonidine were shared with this patient as options for management of her vasomotor instability. She will consider the options and contact us with her decision  HPI:  Olivia Sweeney is a 47 y.o. year old  She then underwent a Robtic hysterectom BSO BPLND on 02/2013 by Dr. Duard Brady  without complications.  Her postoperative course was uncomplicated  Her final pathologic diagnosis is a Stage IA Grade 1 endometrioid endometrial cancer with no lymphovascular space invasion, 3/16 mm of myometrial invasion and negative lymph nodes.  She is seen today for a  postoperative check and to discuss her pathology results and treatment plan. She reports vaginal bleeding a few weeks ago. Reports too numerous to count hot flashes during the day and inability to get a good night's sleep.  Review of systems: Constitutional:  She has no weight gain or weight loss. She has no fever or chills. Eyes: No blurred vision Ears, Nose, Mouth, Throat: No dizziness, headaches or changes in hearing. No mouth sores. Cardiovascular: No chest pain, palpitations or edema. Respiratory:  No shortness of breath, wheezing or cough Gastrointestinal: She has normal bowel movements without diarrhea or constipation. She denies any nausea or vomiting. She denies blood in her stool or heart burn. Genitourinary:  She denies pelvic pain, pelvic pressure or changes in her urinary function. She has no hematuria, dysuria, or incontinence. She has no irregular vaginal bleeding or vaginal discharge Musculoskeletal: Denies muscle weakness or joint pains.  Neurological:  Denies dizziness or headaches. No neuropathy, no numbness or tingling. Reports vasomotor instability.  Reports awakening multiple times at night because of hot flashes Psychiatric:  She denies depression or anxiety. Hematologic/Lymphatic:   No easy bruising or bleeding   Physical Exam: BP 140/80  Pulse 60  Temp(Src) 98.2 F (36.8 C) (Oral)  Resp 16  Ht 5\' 5"  (1.651 m)  Wt 128 lb 6.4 oz (58.242 kg)  BMI 21.37 kg/m2  General: Well dressed, well nourished in no apparent distress.   HEENT:  Normocephalic and atraumatic, no lesions.  Skin: Rash involving the entire face  No palpable LN or masses. Lungs:  Clear to auscultation bilaterally.  No wheezes. Cardiovascular:  Regular rate and rhythm.  No murmurs or rubs. Abdomen:  Soft, nontender, nondistended.  No palpable masses.  No hepatosplenomegaly.  No ascites. Normal bowel sounds.  No hernias.  Incision is XXX Genitourinary: Normal EGBUS  Vaginal cuff intact. Old blood  noted within the vagina. Vaginal cuff without any lesion   No cul de sac fullness. Extremities: No cyanosis, clubbing or edema.  No calf tenderness or erythema. No palpable cords. Psychiatric: Mood and affect are appropriate. Neurological: Awake, alert and oriented x 3. Sensation is intact, no neuropathy.  Musculoskeletal: No pain, normal strength and range of motion.

## 2013-05-14 NOTE — Patient Instructions (Addendum)
F/U with Dr. Dareen Piano in 10/2013 F/U with Gyn Onc 04/2014  Call if you would like to consider Megace, Effexor or clonidine for management of hot flashes.    Thank you very much Ms. Olivia Sweeney for allowing me to provide care for you today.  I appreciate your confidence in choosing our Gynecologic Oncology team.  If you have any questions about your visit today please call our office and we will get back to you as soon as possible.  Olivia Sweeney. Olivia Liggins MD., PhD Gynecologic Oncology

## 2013-06-12 ENCOUNTER — Telehealth: Payer: Self-pay | Admitting: Gynecologic Oncology

## 2013-06-12 NOTE — Telephone Encounter (Signed)
Message left for the patient about IHC testing results and Dr. Forrestine Him recommendations for genetic testing.  IHC testing and further recommendations if tests were positive discussed at her last visit with Dr. Nelly Rout.  Advised to please call the office so a referral could be made to genetics.

## 2013-06-16 ENCOUNTER — Other Ambulatory Visit: Payer: Self-pay | Admitting: Gynecologic Oncology

## 2013-06-16 DIAGNOSIS — C541 Malignant neoplasm of endometrium: Secondary | ICD-10-CM

## 2013-06-16 NOTE — Progress Notes (Signed)
Spoke with patient about results of IHC testing on surgical specimen.  Advised of Dr. Forrestine Him recommendations for genetics referral.  Agreeable to meeting with the genetics counselor.  Instructed to call for any questions or concerns.

## 2013-06-18 ENCOUNTER — Telehealth: Payer: Self-pay | Admitting: Genetic Counselor

## 2013-06-18 NOTE — Telephone Encounter (Signed)
LVOM FOR PT TO RETURN CALL IN RE TO GENETIC APPT.  °

## 2013-06-18 NOTE — Telephone Encounter (Signed)
S/W PT IN RE TO GENETIC APPT 10/27 @ 10 W/KAREN POWELL  [employer] MAILED

## 2013-08-24 ENCOUNTER — Encounter: Payer: Self-pay | Admitting: Genetic Counselor

## 2013-08-24 ENCOUNTER — Other Ambulatory Visit: Payer: 59 | Admitting: Lab

## 2013-08-24 ENCOUNTER — Ambulatory Visit (HOSPITAL_BASED_OUTPATIENT_CLINIC_OR_DEPARTMENT_OTHER): Payer: 59 | Admitting: Genetic Counselor

## 2013-08-24 DIAGNOSIS — C55 Malignant neoplasm of uterus, part unspecified: Secondary | ICD-10-CM

## 2013-08-24 DIAGNOSIS — Z8051 Family history of malignant neoplasm of kidney: Secondary | ICD-10-CM

## 2013-08-24 DIAGNOSIS — Z803 Family history of malignant neoplasm of breast: Secondary | ICD-10-CM

## 2013-08-24 DIAGNOSIS — Z8041 Family history of malignant neoplasm of ovary: Secondary | ICD-10-CM

## 2013-08-24 DIAGNOSIS — Z8049 Family history of malignant neoplasm of other genital organs: Secondary | ICD-10-CM

## 2013-08-24 DIAGNOSIS — IMO0002 Reserved for concepts with insufficient information to code with codable children: Secondary | ICD-10-CM

## 2013-08-24 NOTE — Progress Notes (Signed)
Dr.  Laurette Schimke requested a consultation for genetic counseling and risk assessment for Olivia Sweeney, a 47 y.o. female, for discussion of her personal history of uterine cancer.  She presents to clinic today to discuss the possibility of a genetic predisposition to cancer, and to further clarify her risks, as well as her family members' risks for cancer.   HISTORY OF PRESENT ILLNESS: In May 2014, at the age of 41, Olivia Sweeney was diagnosed with uterine cancer. This was treated with surgery, but did not need chemotherapy or radiation.  Tumor testing found loss of expression of MSH2 and MSH6 suggesting Lynch syndrome.  The patient has had a colonoscopy in her 30s for blood in her stool.  She reports that no polyps were found.  In February, the patient had a mammogram that found a lump that was biopsied.  The lump was benign, but she is due for another mammogram. The patient is very anxious about her risk for breast cancer, specifically.     Past Medical History  Diagnosis Date  . Diabetes mellitus without complication     diet controlled  . Uterine cancer 2014    Past Surgical History  Procedure Laterality Date  . Abdominal exploration surgery      looking for fibroids  . Dental implant      also had soft tissue graft after dental implant   . Anterior cervical decomp/discectomy fusion  03/06/2012    Procedure: ANTERIOR CERVICAL DECOMPRESSION/DISCECTOMY FUSION 1 LEVEL;  Surgeon: Emilee Hero, MD;  Location: [facility] OR;  Service: Orthopedics;  Laterality: Left;  Anterior cervical decompression fusion, cervical 5-6, cervical 6-7 with instrumentation and allograft.  . Cataracts    . Hysteroscopy with novasure N/A 02/09/2013    Procedure: HYSTEROSCOPY WITH NOVASURE;  Surgeon: Levi Aland, MD;  Location: [facility] ORS;  Service: Gynecology;  Laterality: N/A;  . Robotic assisted total hysterectomy with bilateral salpingo oopherectomy N/A 03/17/2013    Procedure: ROBOTIC ASSISTED TOTAL  HYSTERECTOMY WITH BILATERAL SALPINGO OOPHORECTOMY;  Surgeon: Rejeana Brock A. Duard Brady, MD;  Location: [facility] ORS;  Service: Gynecology;  Laterality: N/A;  . Lymph node dissection Bilateral 03/17/2013    Procedure: LYMPH NODE DISSECTION;  Surgeon: Rejeana Brock A. Duard Brady, MD;  Location: [facility] ORS;  Service: Gynecology;  Laterality: Bilateral;    History   Social History  . Marital Status: Divorced    Spouse Name: N/A    Number of Children: 0  . Years of Education: N/A   Occupational History  . Print production planner    Social History Main Topics  . Smoking status: Never Smoker   . Smokeless tobacco: Never Used  . Alcohol Use: 0.0 oz/week    4-8 drink(s) per week     Comment: 14-21  . Drug Use: No  . Sexual Activity: None   Other Topics Concern  . None   Social History Narrative  . None    REPRODUCTIVE HISTORY AND PERSONAL RISK ASSESSMENT FACTORS: Menarche was at age 18.   Surgical Menopause in May Uterus Intact: no Ovaries Intact: no [id number], first live birth at age N/A  She has not previously undergone treatment for infertility.     FAMILY HISTORY:  We obtained a detailed, 4-generation family history.  Significant diagnoses are listed below: Family History  Problem Relation Age of Onset  . Breast cancer Mother     dx in her late 40s-early 54s  . Uterine cancer Mother     dx in her 75s  . Kidney  cancer Mother     dx in her 75s  . Ovarian cancer Maternal Aunt 48  . Uterine cancer Maternal Grandmother     dx in her 7s  . Uterine cancer Other     Patient's maternal ancestors are of [place] descent, and paternal ancestors are of possible [place] descent. There is no reported Ashkenazi Jewish ancestry. There is no known consanguinity.  GENETIC COUNSELING ASSESSMENT: Olivia Sweeney is a 47 y.o. female with a personal history of uterine cancer with an IHC indicating loss of expression of MSH6 and MSH2 which somewhat suggestive of Lynch syndrome and predisposition to cancer. We,  therefore, discussed and recommended the following at today's visit.   DISCUSSION: We reviewed the characteristics, features and inheritance patterns of hereditary cancer syndromes. We also discussed genetic testing, including the appropriate family members to test, the process of testing, insurance coverage and turn-around-time for results. We discussed that her personal and family history of cancer and the tumor testing were consistent with a diagnosis of Lynch syndrome.  Therefore, we will start genetic testing with the MSH2 and MSH6 genes that the [facility] is suggesting as the cause of her personal history of uterine cancer.  If this testing is negative, we discussed that the cause of her personal and family history of cancer could be the result of a different gene.  Therefore we will reflex to the endometrial cancer panel.  She is very anxious about her risk for breast cancer.  We discussed that she is being followed carefully for her previous breast finding, and that testing positive for a hereditary cancer syndrome may or may not allow [place] to offer anything further. We also discussed that if she is negative for mutations within the MSH2 and MSH6 gene, that the endometrial cancer panel can give her more information about her genetic risk for breast cancer.  PLAN: After considering the risks, benefits, and limitations, Olivia Sweeney provided informed consent to pursue genetic testing and the blood sample will be sent to [facility] for analysis of the MSH2 and MSH6 genes, reflexing to the endometrial cancer panel if those genes are negative. We discussed the implications of a positive, negative and/ or variant of uncertain significance genetic test result. Results should be available within approximately 2 weeks' time for the single genes, and an additional 3 weeks for the remainder of the panel, at which point they will be disclosed by telephone to Olivia Sweeney, as will any additional  recommendations warranted by these results. Olivia Sweeney will receive a summary of her genetic counseling visit and a copy of her results once available. This information will also be available in Epic. We encouraged Olivia Sweeney to remain in contact with cancer genetics annually so that we can continuously update the family history and inform her of any changes in cancer genetics and testing that may be of benefit for her family. Demari Kropp Gibeau's questions were answered to her satisfaction today. Our contact information was provided should additional questions or concerns arise.  The patient was seen for a total of 60 minutes, greater than 50% of which was spent face-to-face counseling.  This note will also be sent to the referring provider via the electronic medical record. The patient will be supplied with a summary of this genetic counseling discussion as well as educational information on the discussed hereditary cancer syndromes following the conclusion of their visit.   Patient was discussed with Dr. Drue Second.  _______________________________________________________________________ For Office Staff:  Number of people involved in session: 2 Was an Intern/ student involved with case: no

## 2013-09-03 ENCOUNTER — Encounter: Payer: Self-pay | Admitting: Genetic Counselor

## 2013-09-03 ENCOUNTER — Telehealth: Payer: Self-pay | Admitting: Genetic Counselor

## 2013-09-03 ENCOUNTER — Other Ambulatory Visit: Payer: Self-pay | Admitting: Obstetrics and Gynecology

## 2013-09-03 DIAGNOSIS — N6320 Unspecified lump in the left breast, unspecified quadrant: Secondary | ICD-10-CM

## 2013-09-03 DIAGNOSIS — Z1509 Genetic susceptibility to other malignant neoplasm: Secondary | ICD-10-CM | POA: Insufficient documentation

## 2013-09-03 NOTE — Telephone Encounter (Signed)
Left message that we had her test results and to please call.

## 2013-09-08 ENCOUNTER — Ambulatory Visit (HOSPITAL_BASED_OUTPATIENT_CLINIC_OR_DEPARTMENT_OTHER): Payer: Self-pay | Admitting: Genetic Counselor

## 2013-09-08 DIAGNOSIS — C55 Malignant neoplasm of uterus, part unspecified: Secondary | ICD-10-CM

## 2013-09-08 DIAGNOSIS — Z800 Family history of malignant neoplasm of digestive organs: Secondary | ICD-10-CM

## 2013-09-08 DIAGNOSIS — Z1509 Genetic susceptibility to other malignant neoplasm: Secondary | ICD-10-CM

## 2013-09-08 NOTE — Progress Notes (Signed)
Patient did not keep appointment.

## 2013-09-09 ENCOUNTER — Encounter: Payer: Self-pay | Admitting: Genetic Counselor

## 2013-09-09 ENCOUNTER — Encounter (INDEPENDENT_AMBULATORY_CARE_PROVIDER_SITE_OTHER): Payer: Self-pay

## 2013-09-09 ENCOUNTER — Ambulatory Visit (HOSPITAL_BASED_OUTPATIENT_CLINIC_OR_DEPARTMENT_OTHER): Payer: 59 | Admitting: Genetic Counselor

## 2013-09-09 ENCOUNTER — Encounter: Payer: Self-pay | Admitting: Gastroenterology

## 2013-09-09 DIAGNOSIS — Z1509 Genetic susceptibility to other malignant neoplasm: Secondary | ICD-10-CM

## 2013-09-09 DIAGNOSIS — C55 Malignant neoplasm of uterus, part unspecified: Secondary | ICD-10-CM

## 2013-09-09 DIAGNOSIS — IMO0002 Reserved for concepts with insufficient information to code with codable children: Secondary | ICD-10-CM

## 2013-09-09 NOTE — Progress Notes (Signed)
Olivia Sweeney, a 47 y.o. female, for discussion of her recent genetic test results finding a deleterious mutation in MSH2 which is causative of Lynch Syndrome.  She presents to clinic today to discuss the possibility of a genetic predisposition to cancer, and to further clarify her risks, as well as her family members' risks for cancer.   HISTORY OF PRESENT ILLNESS: In 2014, at the age of 42, Olivia Sweeney was diagnosed with uterine cancer. This was treated with a hysterectomy, including the removal of her fallopian tubes and ovaries.  MSI and IHC testing suggested an MSH2 or MSH6 mutation.  Genetic testing was sent to [facility] which confirmed a mutation within MSH2.    Past Medical History  Diagnosis Date  . Diabetes mellitus without complication     diet controlled  . Uterine cancer 2014    Past Surgical History  Procedure Laterality Date  . Abdominal exploration surgery      looking for fibroids  . Dental implant      also had soft tissue graft after dental implant   . Anterior cervical decomp/discectomy fusion  03/06/2012    Procedure: ANTERIOR CERVICAL DECOMPRESSION/DISCECTOMY FUSION 1 LEVEL;  Surgeon: Emilee Hero, MD;  Location: [facility] OR;  Service: Orthopedics;  Laterality: Left;  Anterior cervical decompression fusion, cervical 5-6, cervical 6-7 with instrumentation and allograft.  . Cataracts    . Hysteroscopy with novasure N/A 02/09/2013    Procedure: HYSTEROSCOPY WITH NOVASURE;  Surgeon: Levi Aland, MD;  Location: [facility] ORS;  Service: Gynecology;  Laterality: N/A;  . Robotic assisted total hysterectomy with bilateral salpingo oopherectomy N/A 03/17/2013    Procedure: ROBOTIC ASSISTED TOTAL HYSTERECTOMY WITH BILATERAL SALPINGO OOPHORECTOMY;  Surgeon: Rejeana Brock A. Duard Brady, MD;  Location: [facility];  Service: Gynecology;  Laterality: N/A;  . Lymph node dissection Bilateral 03/17/2013    Procedure: LYMPH NODE DISSECTION;  Surgeon: Rejeana Brock A. Duard Brady, MD;  Location: [facility] ORS;   Service: Gynecology;  Laterality: Bilateral;    History   Social History  . Marital Status: Divorced    Spouse Name: N/A    Number of Children: 0  . Years of Education: N/A   Occupational History  . Print production planner    Social History Main Topics  . Smoking status: Never Smoker   . Smokeless tobacco: Never Used  . Alcohol Use: 0.0 oz/week    4-8 drink(s) per week     Comment: 14-21  . Drug Use: No  . Sexual Activity: None   Other Topics Concern  . None   Social History Narrative  . None    FAMILY HISTORY:  We obtained a detailed, 4-generation family history.  Significant diagnoses are listed below: Family History  Problem Relation Age of Onset  . Breast cancer Mother     dx in her late 40s-early 73s  . Uterine cancer Mother     dx in her 9s  . Kidney cancer Mother     dx in her 1s  . Ovarian cancer Maternal Aunt 48  . Uterine cancer Maternal Grandmother     dx in her 89s  . Uterine cancer Other    Patient's maternal ancestors are of [place] and [place] descent, and paternal ancestors are of possible [place] descent. There is no reported Ashkenazi Jewish ancestry. There is no known consanguinity.  GENETIC COUNSELING ASSESSMENT: Olivia Sweeney is a 47 y.o. female with a personal history of uterine cancer as the result of Lynch syndrome. We, therefore, discussed and  recommended the following at today's visit.   DISCUSSION: MSH2 is one of the mismatch repair genes who's function is to find and repair DNA damage.  This is primarily expressed in the colon, uterus and ovaries and therefore. increases the chances for these types of cancers, as well as others.  Families with Lynch Syndrome tend to have multiple family members with these cancers, typically diagnosed under age 37, and diagnoses in multiple generations. This can be seen in your family.    We reviewed the risks for cancer and emphasized that while the risks are increased, they do not approach 100%.  NCCN  screening and management guidelines to reduce the risk or find a cancer early were reivewed.  Olivia Sweeney has not had a colonoscopy since she was in her 19s.  We contacted [facility] and set up an appointment with Dr. Wendall Papa on Dec. 12.  She is concerned because she feels that she may lose her health insurance at the beginning of the year.  She is in the process of trying to learn about her options.  We dicussed that there is a program where she can receive an "orange card" that may provide colonoscopy on a sliding scale.     PLAN:  We encouraged Olivia Sweeney to remain in contact with cancer genetics annually so that we can continuously update the family history and inform her of any changes in cancer genetics and testing that may be of benefit for her family. Olivia Sweeney's questions were answered to her satisfaction today. Our contact information was provided should additional questions or concerns arise.  The patient was seen for a total of 60 minutes, greater than 50% of which was spent face-to-face counseling.  This note will also be sent to the referring provider via the electronic medical record. The patient will be supplied with a summary of this genetic counseling discussion as well as educational information on the discussed hereditary cancer syndromes following the conclusion of their visit.   Patient was discussed with Dr. Drue Second.   _______________________________________________________________________ For Office Staff:  Number of people involved in session: 2 Was an Intern/ student involved with case: no

## 2013-09-22 ENCOUNTER — Ambulatory Visit
Admission: RE | Admit: 2013-09-22 | Discharge: 2013-09-22 | Disposition: A | Discharge status: Home / Self Care | Payer: 59 | Source: Ambulatory Visit | Attending: Obstetrics and Gynecology | Admitting: Obstetrics and Gynecology

## 2013-09-22 DIAGNOSIS — N6320 Unspecified lump in the left breast, unspecified quadrant: Secondary | ICD-10-CM

## 2013-10-09 ENCOUNTER — Encounter: Payer: Self-pay | Admitting: Gastroenterology

## 2013-10-09 ENCOUNTER — Ambulatory Visit (INDEPENDENT_AMBULATORY_CARE_PROVIDER_SITE_OTHER): Payer: 59 | Admitting: Gastroenterology

## 2013-10-09 ENCOUNTER — Other Ambulatory Visit (INDEPENDENT_AMBULATORY_CARE_PROVIDER_SITE_OTHER): Payer: 59

## 2013-10-09 VITALS — BP 142/80 | HR 76 | Ht 65.0 in | Wt 130.0 lb

## 2013-10-09 DIAGNOSIS — Z1509 Genetic susceptibility to other malignant neoplasm: Secondary | ICD-10-CM

## 2013-10-09 DIAGNOSIS — Z800 Family history of malignant neoplasm of digestive organs: Secondary | ICD-10-CM

## 2013-10-09 LAB — URINALYSIS, ROUTINE W REFLEX MICROSCOPIC
Nitrite: POSITIVE
Specific Gravity, Urine: >=1.03 (ref 1.000–1.030)
Total Protein, Urine: 100
Urobilinogen, UA: 0.2 (ref 0.0–1.0)

## 2013-10-09 MED ORDER — MOVIPREP 100 G PO SOLR: 1.0000 | Freq: Once | ORAL | Status: DC

## 2013-10-09 NOTE — Patient Instructions (Addendum)
You will be set up for a colonoscopy (for lynch syndrome) You will be set up for an upper endoscopy (for lynch syndrome) You will have labs checked today in the basement lab.  Please head down after you check out with the front desk  (urinalysis). Dermatology referral for lynch syndrome, annual skin checks.

## 2013-10-09 NOTE — Progress Notes (Signed)
HPI: This is a very pleasant 47 year old woman whom I meeting for the first time today.  She was [facility] due to new diagnosis of Lynch syndrome   No trouble with constipation, bleeding.  Had colonoscopy 20 years ago, was normal, done for bleeding.  uterine cancer. This was treated with a hysterectomy, including the removal of her fallopian tubes and ovaries.  MSI and IHC testing suggested an MSH2 or MSH6 mutation.  Genetic testing was sent to [facility] which confirmed a mutation within MSH2.   Review of systems: Pertinent positive and negative review of systems were noted in the above HPI section. Complete review of systems was performed and was otherwise normal.    Past Medical History  Diagnosis Date  . Diabetes mellitus without complication     diet controlled  . Uterine cancer 2014  . Lynch syndrome     Past Surgical History  Procedure Laterality Date  . Abdominal exploration surgery      looking for fibroids  . Dental implant      also had soft tissue graft after dental implant   . Anterior cervical decomp/discectomy fusion  03/06/2012    Procedure: ANTERIOR CERVICAL DECOMPRESSION/DISCECTOMY FUSION 1 LEVEL;  Surgeon: Emilee Hero, MD;  Location: [facility] OR;  Service: Orthopedics;  Laterality: Left;  Anterior cervical decompression fusion, cervical 5-6, cervical 6-7 with instrumentation and allograft.  . Cataracts    . Hysteroscopy with novasure N/A 02/09/2013    Procedure: HYSTEROSCOPY WITH NOVASURE;  Surgeon: Levi Aland, MD;  Location: [facility] ORS;  Service: Gynecology;  Laterality: N/A;  . Robotic assisted total hysterectomy with bilateral salpingo oopherectomy N/A 03/17/2013    Procedure: ROBOTIC ASSISTED TOTAL HYSTERECTOMY WITH BILATERAL SALPINGO OOPHORECTOMY;  Surgeon: Rejeana Brock A. Duard Brady, MD;  Location: [facility];  Service: Gynecology;  Laterality: N/A;  . Lymph node dissection Bilateral 03/17/2013    Procedure: LYMPH NODE  DISSECTION;  Surgeon: Rejeana Brock A. Duard Brady, MD;  Location: [facility] OR[facility];  Service: Gynecology;  Laterality: Bilateral;    No current outpatient prescriptions on file.   No current facility-administered medications for this visit.    Allergies as of 10/09/2013 - Review Complete 10/09/2013  Allergen Reaction Noted  . Bee venom Shortness Of Breath and Swelling 02/19/2012  . Other Other (See Comments) 02/02/2013    Family History  Problem Relation Age of Onset  . Breast cancer Mother     dx in her late 40s-early 8s  . Uterine cancer Mother     dx in her 56s  . Kidney cancer Mother     dx in her 44s  . Ovarian cancer Maternal Aunt 48  . Uterine cancer Maternal Grandmother     dx in her 3s  . Uterine cancer Other     History   Social History  . Marital Status: Divorced    Spouse Name: N/A    Number of Children: 0  . Years of Education: N/A   Occupational History  . Print production planner    Social History Main Topics  . Smoking status: Never Smoker   . Smokeless tobacco: Never Used  . Alcohol Use: 0.0 oz/week    4-8 drink(s) per week     Comment: 14-21  . Drug Use: No  . Sexual Activity: Not on file   Other Topics Concern  . Not on file   Social History Narrative  . No narrative on file       Physical Exam: BP  142/80  Pulse 76  Ht 5\' 5"  (1.651 m)  Wt 130 lb (58.968 kg)  BMI 21.63 kg/m2  LMP 02/16/2013 Constitutional: generally well-appearing Psychiatric: alert and oriented x3 Eyes: extraocular movements intact Mouth: oral pharynx moist, no lesions Neck: supple no lymphadenopathy Cardiovascular: heart regular rate and rhythm Lungs: clear to auscultation bilaterally Abdomen: soft, nontender, nondistended, no obvious ascites, no peritoneal signs, normal bowel sounds Extremities: no lower extremity edema bilaterally Skin: no lesions on visible extremities    Assessment and plan: 47 y.o. female with  for Lynch syndrome  We reviewed up to date recommendations for  Lynch syndrome screening, see those below. I also printed her out very nice handout on Lynch syndrome. We will go ahead with colonoscopy and upper endoscopy for her now. I am also making a referral to a dermatologist and we will get a urinalysis on her today.    Current recommendations for Lynch syndrome screening:  Annual colonoscopy starting between the ages of 83 and 25 years, or 10 years prior to the earliest age of colon cancer diagnosis in the family (whichever comes first). In families with MSH6 mutations, screening can start at age 12 years since the onset of colon cancer is later in these families.   Annual screening for endometrial cancer with endometrial biopsy and ovarian cancer with CA 125 and transvaginal ultrasound beginning at age 38 to 26 years, or 5 to 10 years earlier than the earliest age of first diagnosis of these cancers in the family (whichever is earlier).   Discussion of prophylactic hysterectomy and salpingo-oophorectomy at the end of childbearing years.  Annual urinalysis beginning at age 26 to 25 years.   Annual skin surveillance.   Periodic upper endoscopy.

## 2013-10-12 ENCOUNTER — Encounter: Payer: Self-pay | Admitting: Gastroenterology

## 2013-10-13 ENCOUNTER — Telehealth: Payer: Self-pay | Admitting: Internal Medicine

## 2013-10-13 DIAGNOSIS — R8290 Unspecified abnormal findings in urine: Secondary | ICD-10-CM

## 2013-10-13 NOTE — Telephone Encounter (Signed)
Advise patient: Had an abnormal urinalysis at the  GI doctor,  recommend to repeat that UA and urine culture, dx hematuria, please arrange  Also find out if the patient is having any symptoms.  Depending on the results I may ask her to come back for a office visit

## 2013-10-15 NOTE — Telephone Encounter (Signed)
lmovm \. DJR  

## 2013-10-15 NOTE — Telephone Encounter (Signed)
Pt notified. Will drop off a urine sample. DJR

## 2013-10-15 NOTE — Addendum Note (Signed)
Addended by: Eustace Quail on: 10/15/2013 10:47 AM   Modules accepted: Orders

## 2013-11-23 ENCOUNTER — Encounter: Payer: 59 | Admitting: Gastroenterology

## 2013-12-09 ENCOUNTER — Encounter (HOSPITAL_COMMUNITY): Payer: Self-pay

## 2013-12-21 ENCOUNTER — Encounter: Payer: 59 | Admitting: Gastroenterology

## 2014-01-13 ENCOUNTER — Encounter: Payer: 59 | Admitting: Gastroenterology

## 2014-01-13 ENCOUNTER — Telehealth: Payer: Self-pay | Admitting: Gastroenterology

## 2014-01-14 NOTE — Telephone Encounter (Signed)
Other phone note from today says she is sick with the flu.  Not sure what is truly going on with her, no charge.

## 2014-01-14 NOTE — Telephone Encounter (Signed)
No charge. 

## 2014-02-16 IMAGING — US US BREAST*L*
1 series · 11 of 11 positions shown · non-contrast
Comparison: Previous examinations, including the screening
mammogram obtained at [facility] on 11/20/2012.

CLINICAL DATA: Possible left breast mass at recent screening
mammography.

DIGITAL DIAGNOSTIC LEFT MAMMOGRAM  AND LEFT BREAST ULTRASOUND:

[Series 1: us breast*left* · 11 of 11 slices shown]
[im 1/11]
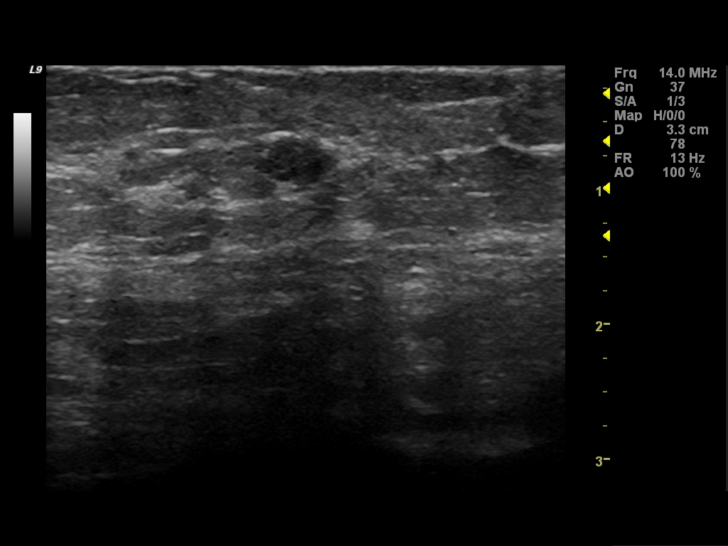
[im 2/11]
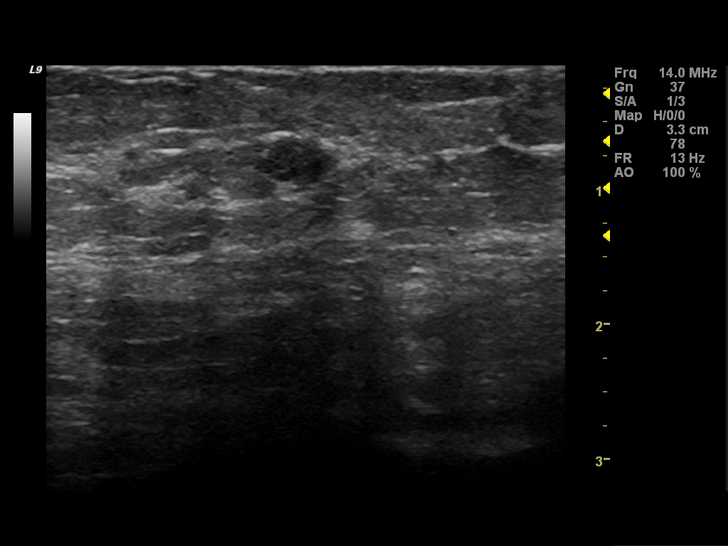
[im 3/11]
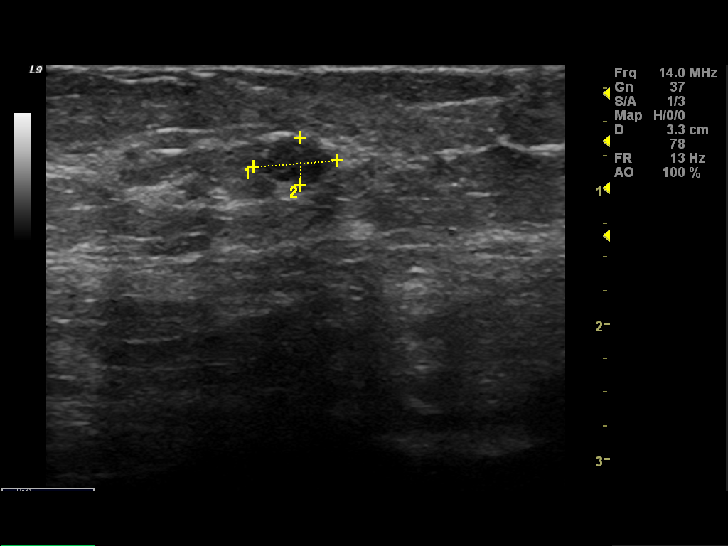
[im 4/11]
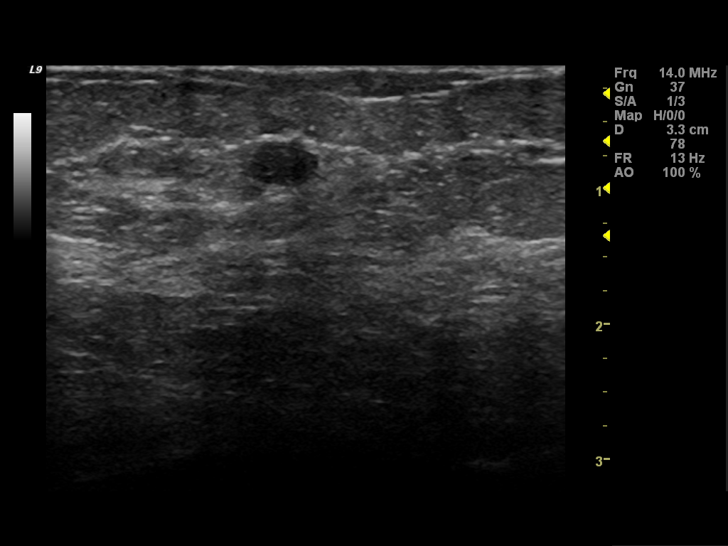
[im 5/11]
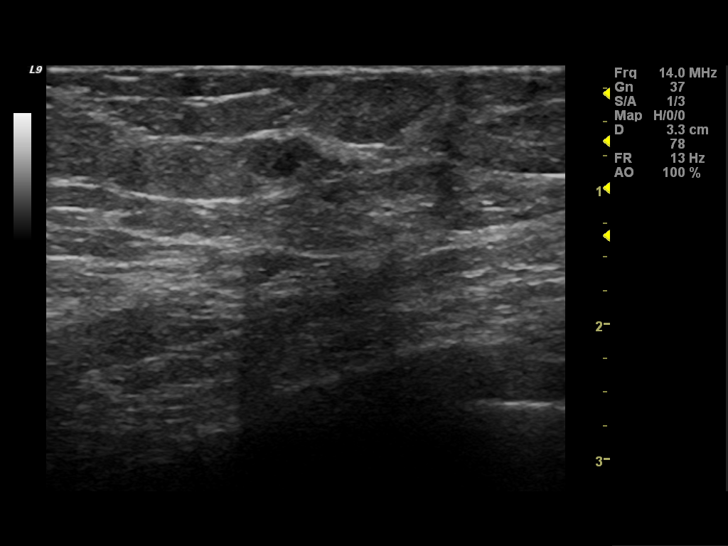
[im 6/11]
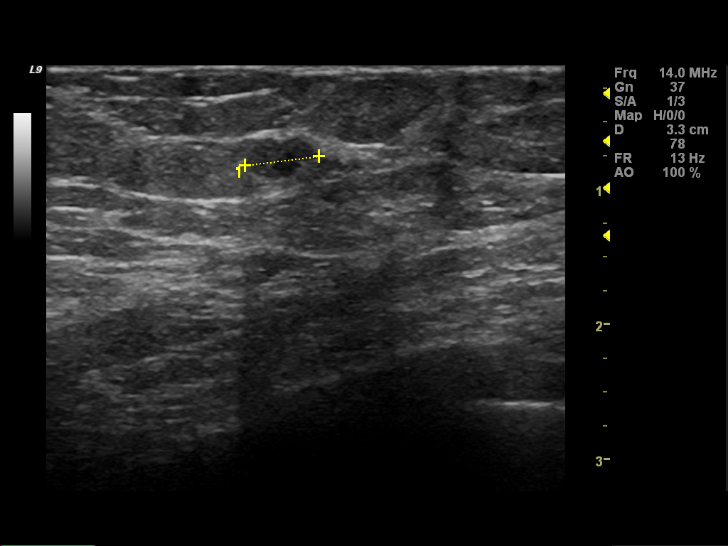
[im 7/11]
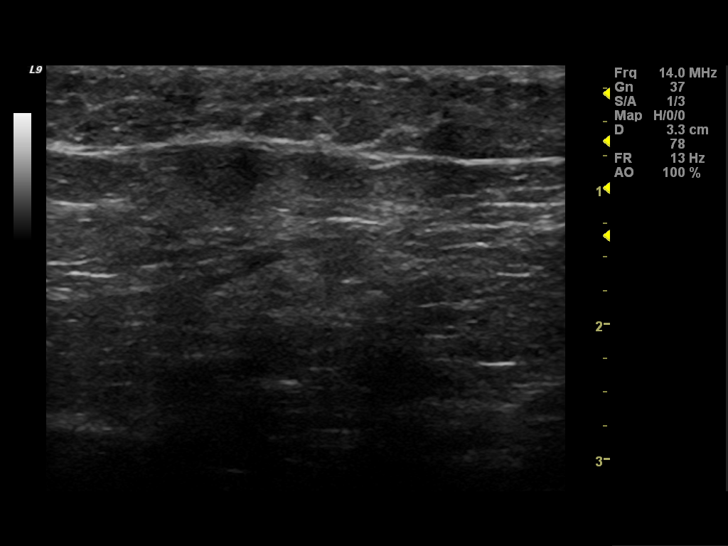
[im 8/11]
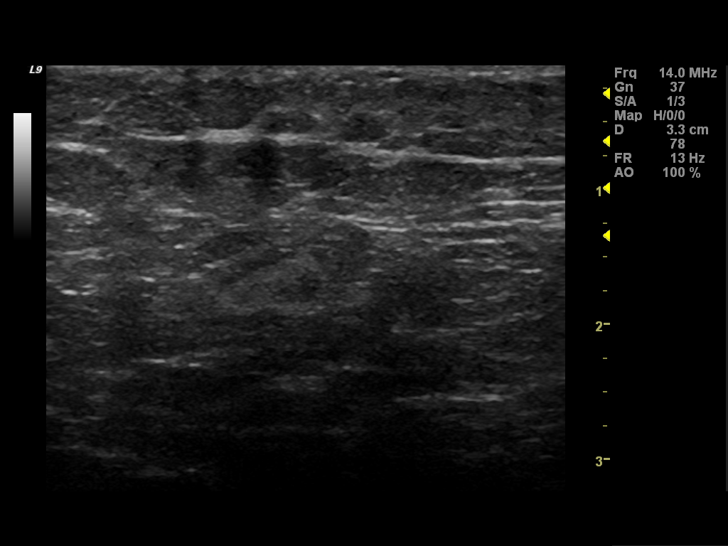
[im 9/11]
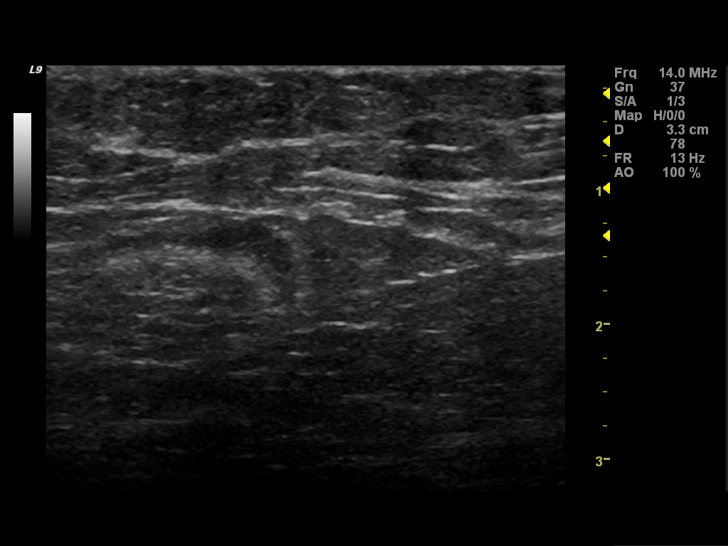
[im 10/11]
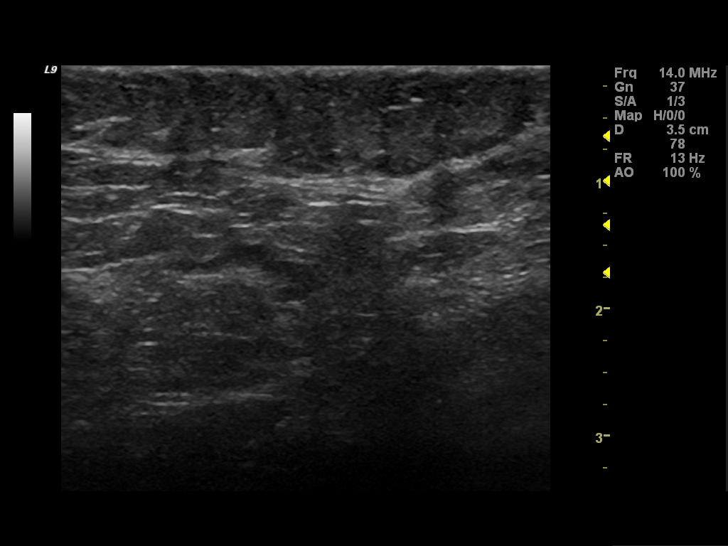
[im 11/11]
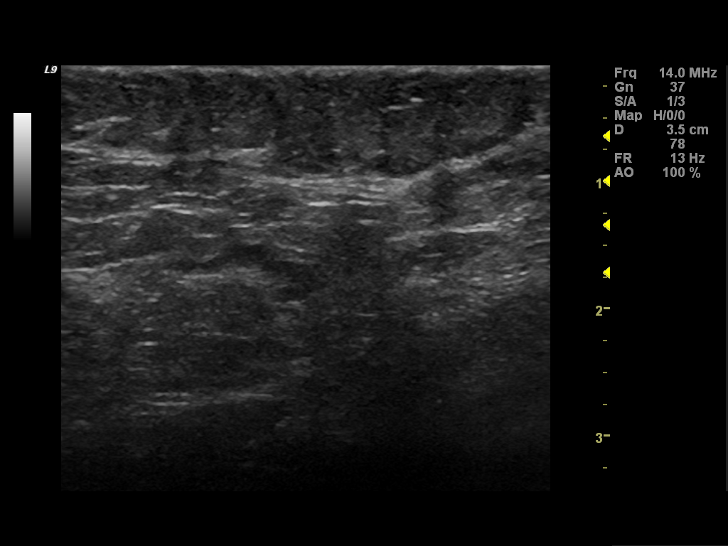

[11 of 11 positions shown; findings below may reference images not displayed]

FINDINGS: ACR Breast Density Category 2: There is a scattered fibroglandular
pattern.

Spot compression views of the left breast confirm a small, oval,
partially obscured nodular density in the upper outer portion of
the breast.  There is a suggestion of a second smaller nodular
density in that region.

On physical exam, no mass is palpable in the outer left breast.
There are no palpable left axillary lymph nodes.

Ultrasound is performed, showing a 6 x 6 x 4 mm oval, horizontally
oriented, hypoechoic solid mass in the 3 o'clock position of the
left breast, 6 cm from the nipple.  This has mildly irregular,
microlobulated margins.  No other abnormalities were seen in the
outer left breast.

Ultrasound of the left axilla demonstrated normal appearing lymph
nodes.
IMPRESSION: 6 mm solid mass in the 3 o'clock position of the left breast with
imaging features suspicious for the possibility of malignancy.
Ultrasound-guided core needle biopsy is recommended.  This has been
discussed with the patient and scheduled to follow.

RECOMMENDATION:
Left breast ultrasound guided core needle biopsy (scheduled to
follow).

I have discussed the findings and recommendations with the patient.
Results were also provided in writing at the conclusion of the
visit.

BI-RADS CATEGORY 4:  Suspicious abnormality - biopsy should be
considered.

## 2014-02-22 ENCOUNTER — Encounter: Payer: Self-pay | Admitting: Gastroenterology

## 2014-02-22 ENCOUNTER — Ambulatory Visit (AMBULATORY_SURGERY_CENTER): Payer: 59 | Admitting: Gastroenterology

## 2014-02-22 VITALS — BP 124/73 | HR 81 | Temp 96.5°F | Resp 14 | Ht 65.0 in | Wt 130.0 lb

## 2014-02-22 DIAGNOSIS — Z800 Family history of malignant neoplasm of digestive organs: Secondary | ICD-10-CM

## 2014-02-22 DIAGNOSIS — Z1211 Encounter for screening for malignant neoplasm of colon: Secondary | ICD-10-CM

## 2014-02-22 DIAGNOSIS — Z1509 Genetic susceptibility to other malignant neoplasm: Secondary | ICD-10-CM

## 2014-02-22 MED ORDER — SODIUM CHLORIDE 0.9 % IV SOLN: 500.0000 mL | INTRAVENOUS | Status: DC

## 2014-02-22 NOTE — Progress Notes (Signed)
Poor prep, colonoscopy, stopped per dr. Ardis Hughs.

## 2014-02-22 NOTE — Patient Instructions (Signed)
YOU HAD AN ENDOSCOPIC PROCEDURE TODAY AT THE [facility]: Refer to the procedure report that was given to you for any specific questions about what was found during the examination.  If the procedure report does not answer your questions, please call your gastroenterologist to clarify.  If you requested that your care partner not be given the details of your procedure findings, then the procedure report has been included in a sealed envelope for you to review at your convenience later.  YOU SHOULD EXPECT: Some feelings of bloating in the abdomen. Passage of more gas than usual.  Walking can help get rid of the air that was put into your GI tract during the procedure and reduce the bloating. If you had a lower endoscopy (such as a colonoscopy or flexible sigmoidoscopy) you may notice spotting of blood in your stool or on the toilet paper. If you underwent a bowel prep for your procedure, then you may not have a normal bowel movement for a few days.  DIET: Your first meal following the procedure should be a light meal and then it is ok to progress to your normal diet.  A half-sandwich or bowl of soup is an example of a good first meal.  Heavy or fried foods are harder to digest and may make you feel nauseous or bloated.  Likewise meals heavy in dairy and vegetables can cause extra gas to form and this can also increase the bloating.  Drink plenty of fluids but you should avoid alcoholic beverages for 24 hours.  ACTIVITY: Your care partner should take you home directly after the procedure.  You should plan to take it easy, moving slowly for the rest of the day.  You can resume normal activity the day after the procedure however you should NOT DRIVE or use heavy machinery for 24 hours (because of the sedation medicines used during the test).    SYMPTOMS TO REPORT IMMEDIATELY: A gastroenterologist can be reached at any hour.  During normal business hours, 8:30 AM to 5:00 PM Monday through Friday,  call [phone].  After hours and on weekends, please call the GI answering service at [phone] who will take a message and have the physician on call contact you.   Following lower endoscopy (colonoscopy or flexible sigmoidoscopy):  Excessive amounts of blood in the stool  Significant tenderness or worsening of abdominal pains  Swelling of the abdomen that is new, acute  Fever of 100F or higher  Following upper endoscopy (EGD)  Vomiting of blood or coffee ground material  New chest pain or pain under the shoulder blades  Painful or persistently difficult swallowing  New shortness of breath  Fever of 100F or higher  Black, tarry-looking stools  FOLLOW UP: If any biopsies were taken you will be contacted by phone or by letter within the next 1-3 weeks.  Call your gastroenterologist if you have not heard about the biopsies in 3 weeks.  Our staff will call the home number listed on your records the next business day following your procedure to check on you and address any questions or concerns that you may have at that time regarding the information given to you following your procedure. This is a courtesy call and so if there is no answer at the home number and we have not heard from you through the emergency physician on call, we will assume that you have returned to your regular daily activities without incident.  SIGNATURES/CONFIDENTIALITY: You and/or your care   partner have signed paperwork which will be entered into your electronic medical record.  These signatures attest to the fact that that the information above on your After Visit Summary has been reviewed and is understood.  Full responsibility of the confidentiality of this discharge information lies with you and/or your care-partner.   Resume medications. 

## 2014-02-22 NOTE — Op Note (Signed)
[place]  [address] [place], [id number]   COLONOSCOPY PROCEDURE REPORT  PATIENT: Olivia Sweeney, Olivia Sweeney  MR#: [id number] BIRTHDATE: 05-Jul-1966 , 47  yrs. old GENDER: Female ENDOSCOPIST: Milus Banister, MD PROCEDURE DATE:  02/22/2014 PROCEDURE:   Colonoscopy, incomplete First Screening Colonoscopy - Avg.  risk and is 50 yrs.  old or older - No.  Prior Negative Screening - Now for repeat screening. N/A  History of Adenoma - Now for follow-up colonoscopy & has been > or = to 3 yrs.  N/A  Polyps Removed Today? No.  Recommend repeat exam, <10 yrs? Yes.  Inadequate prep. ASA CLASS:   Class II INDICATIONS:genetically proven Lynch Syndrome. MEDICATIONS: MAC sedation, administered by CRNA and propofol (Diprivan) 200mg  IV  DESCRIPTION OF PROCEDURE:   After the risks benefits and alternatives of the procedure were thoroughly explained, informed consent was obtained.  A digital rectal exam revealed no abnormalities of the rectum.   The LB [id number]  endoscope was introduced through the anus and advanced to the hepatic flexure. No adverse events experienced.   The quality of the prep was poor.  The instrument was then slowly withdrawn as the colon was fully examined.   COLON FINDINGS: Poor prep greatly limited this examination and procedure was terminated after reaching about the hepatic flexure. Retroflexed views revealed no abnormalities. The time to cecum=NA. Withdrawal time=NA  The scope was withdrawn and the procedure completed. COMPLICATIONS: There were no complications.  ENDOSCOPIC IMPRESSION: This was an incomplete examination. Poor prep greatly limited this examination and procedure was terminated after reaching about the hepatic flexure.  RECOMMENDATIONS: Dr.  Ardis Hughs' office will get in touch with you about repeating colonoscopy.  We will work with you to find prep that will work well, effectively (trial of gatorade/miralax prep split dose  next).   eSigned:  Milus Banister, MD 02/22/2014 1:54 PM

## 2014-02-22 NOTE — Op Note (Signed)
[place]  [address] [place], [id number]   ENDOSCOPY PROCEDURE REPORT  PATIENT: Olivia Sweeney, Olivia Sweeney  MR#: [id number] BIRTHDATE: 1966-01-11 , 47  yrs. old GENDER: Female ENDOSCOPIST: Milus Banister, MD PROCEDURE DATE:  02/22/2014 PROCEDURE:  EGD, diagnostic ASA CLASS:     Class II INDICATIONS:  genetically proven Lynch Syndrome. MEDICATIONS: MAC sedation, administered by CRNA and Propofol (Diprivan) 70 mg IV TOPICAL ANESTHETIC: none  DESCRIPTION OF PROCEDURE: After the risks benefits and alternatives of the procedure were thoroughly explained, informed consent was obtained.  The LB [id number] endoscope was introduced through the mouth and advanced to the second portion of the duodenum. Without limitations.  The instrument was slowly withdrawn as the mucosa was fully examined.    The upper, middle and distal third of the esophagus were carefully inspected and no abnormalities were noted.  The z-line was well seen at the GEJ.  The endoscope was pushed into the fundus which was normal including a retroflexed view.  The antrum, gastric body, first and second part of the duodenum were unremarkable. Retroflexed views revealed no abnormalities.     The scope was then withdrawn from the patient and the procedure completed. COMPLICATIONS: There were no complications.  ENDOSCOPIC IMPRESSION: Normal EGD  RECOMMENDATIONS: For your lynch syndrome, you will need periodic EGD screening.  We will plan on repeat EGD in 3 years.   eSigned:  Milus Banister, MD 02/22/2014 1:56 PM

## 2014-02-22 NOTE — Progress Notes (Signed)
Report to pacu rn, vss, bbs=clear 

## 2014-02-23 ENCOUNTER — Telehealth: Payer: Self-pay

## 2014-02-23 NOTE — Telephone Encounter (Signed)
Left message on answering machine. 

## 2014-02-24 ENCOUNTER — Telehealth: Payer: Self-pay | Admitting: Gastroenterology

## 2014-02-24 NOTE — Telephone Encounter (Signed)
Pt has been scheduled for colon and pre visit appt

## 2014-02-24 NOTE — Telephone Encounter (Signed)
Dr. Ardis Hughs' office will get in touch with you about repeating colonoscopy. We will work with you to find prep that will work well, effectively (trial of gatorade/miralax prep split dose next). eSigned: Quillian Quince

## 2014-03-24 ENCOUNTER — Ambulatory Visit (AMBULATORY_SURGERY_CENTER): Payer: Self-pay | Admitting: *Deleted

## 2014-03-24 VITALS — Ht 64.0 in | Wt 129.0 lb

## 2014-03-24 DIAGNOSIS — Z1509 Genetic susceptibility to other malignant neoplasm: Secondary | ICD-10-CM

## 2014-03-24 NOTE — Progress Notes (Signed)
No egg or soy allergy. No anesthesia problems.  No home O2.  No diet meds.  

## 2014-03-26 ENCOUNTER — Encounter: Payer: Self-pay | Admitting: Gastroenterology

## 2014-03-29 ENCOUNTER — Ambulatory Visit (AMBULATORY_SURGERY_CENTER): Payer: 59 | Admitting: Gastroenterology

## 2014-03-29 ENCOUNTER — Encounter: Payer: Self-pay | Admitting: Gastroenterology

## 2014-03-29 VITALS — BP 126/77 | HR 73 | Temp 95.8°F | Resp 17 | Ht 64.0 in | Wt 129.0 lb

## 2014-03-29 DIAGNOSIS — D126 Benign neoplasm of colon, unspecified: Secondary | ICD-10-CM

## 2014-03-29 DIAGNOSIS — Z1509 Genetic susceptibility to other malignant neoplasm: Secondary | ICD-10-CM

## 2014-03-29 MED ORDER — SODIUM CHLORIDE 0.9 % IV SOLN: 500.0000 mL | INTRAVENOUS | Status: DC

## 2014-03-29 NOTE — Op Note (Signed)
[place]  [address] [place], [id number]   COLONOSCOPY PROCEDURE REPORT  PATIENT: Olivia Sweeney, Olivia Sweeney  MR#: [id number] BIRTHDATE: May 28, 1966 , 47  yrs. old GENDER: Female ENDOSCOPIST: Milus Banister, MD PROCEDURE DATE:  03/29/2014 PROCEDURE:   Colonoscopy with snare polypectomy First Screening Colonoscopy - Avg.  risk and is 50 yrs.  old or older - No.  Prior Negative Screening - Now for repeat screening. N/A  History of Adenoma - Now for follow-up colonoscopy & has been > or = to 3 yrs.  N/A  Polyps Removed Today? Yes. ASA CLASS:   Class II INDICATIONS:elevated risk for colon cancer: Genetically proven Lynch Syndrome. MEDICATIONS: MAC sedation, administered by CRNA and Propofol (Diprivan) 280 mg IV  DESCRIPTION OF PROCEDURE:   After the risks benefits and alternatives of the procedure were thoroughly explained, informed consent was obtained.  A digital rectal exam revealed no abnormalities of the rectum.   The LB [id number]  endoscope was introduced through the anus and advanced to the cecum, which was identified by both the appendix and ileocecal valve. No adverse events experienced.   The quality of the prep was good.  The instrument was then slowly withdrawn as the colon was fully examined.   COLON FINDINGS: Two polyps were found, removed and sent to pathology.  One was heaped up, 1.4cm across, located in ascending segment, removed with snare/cautery (path jar 1).  The other was sessile, located in transverse segment, 22mm across, removed with cold snare (jar 2).  The examination was otherwise normal. Retroflexed views revealed no abnormalities. The time to cecum=4 minutes 13 seconds.  Withdrawal time=11 minutes 47 seconds.  The scope was withdrawn and the procedure completed. COMPLICATIONS: There were no complications.  ENDOSCOPIC IMPRESSION: Two polyps were found, removed and sent to pathology. The examination was otherwise  normal.  RECOMMENDATIONS: You will receive a letter within 1-2 weeks with the results of your biopsy as well as final recommendations.  Given your known Lynch Syndrome, you will likely need repeat colonoscopy in 1 year. Please call my office if you have not received a letter after 3 weeks.   eSigned:  Milus Banister, MD 03/29/2014 1:57 PM

## 2014-03-29 NOTE — Progress Notes (Signed)
Called to room to assist during endoscopic procedure.  Patient ID and intended procedure confirmed with present staff. Received instructions for my participation in the procedure from the performing physician.  

## 2014-03-29 NOTE — Progress Notes (Signed)
Procedure ends, to recovery, report given and VSS. 

## 2014-03-29 NOTE — Patient Instructions (Signed)
Colon polyps x 2 removed today, see handout given on polyps. Repeat colonoscopy in 1 year.  Hold aspirin, NSAIDS, and anti-inflammatory medications for 2 weeks. Call us with any questions or concerns. Thank you!!  YOU HAD AN ENDOSCOPIC PROCEDURE TODAY AT [facility]: Refer to the procedure report that was given to you for any specific questions about what was found during the examination.  If the procedure report does not answer your questions, please call your gastroenterologist to clarify.  If you requested that your care partner not be given the details of your procedure findings, then the procedure report has been included in a sealed envelope for you to review at your convenience later.  YOU SHOULD EXPECT: Some feelings of bloating in the abdomen. Passage of more gas than usual.  Walking can help get rid of the air that was put into your GI tract during the procedure and reduce the bloating. If you had a lower endoscopy (such as a colonoscopy or flexible sigmoidoscopy) you may notice spotting of blood in your stool or on the toilet paper. If you underwent a bowel prep for your procedure, then you may not have a normal bowel movement for a few days.  DIET: Your first meal following the procedure should be a light meal and then it is ok to progress to your normal diet.  A half-sandwich or bowl of soup is an example of a good first meal.  Heavy or fried foods are harder to digest and may make you feel nauseous or bloated.  Likewise meals heavy in dairy and vegetables can cause extra gas to form and this can also increase the bloating.  Drink plenty of fluids but you should avoid alcoholic beverages for 24 hours.  ACTIVITY: Your care partner should take you home directly after the procedure.  You should plan to take it easy, moving slowly for the rest of the day.  You can resume normal activity the day after the procedure however you should NOT DRIVE or use heavy machinery for 24 hours  (because of the sedation medicines used during the test).    SYMPTOMS TO REPORT IMMEDIATELY: A gastroenterologist can be reached at any hour.  During normal business hours, 8:30 AM to 5:00 PM Monday through Friday, call [phone].  After hours and on weekends, please call the GI answering service at [phone] who will take a message and have the physician on call contact you.   Following lower endoscopy (colonoscopy or flexible sigmoidoscopy):  Excessive amounts of blood in the stool  Significant tenderness or worsening of abdominal pains  Swelling of the abdomen that is new, acute  Fever of 100F or higher  Following upper endoscopy (EGD)  Vomiting of blood or coffee ground material  New chest pain or pain under the shoulder blades  Painful or persistently difficult swallowing  New shortness of breath  Fever of 100F or higher  Black, tarry-looking stools  FOLLOW UP: If any biopsies were taken you will be contacted by phone or by letter within the next 1-3 weeks.  Call your gastroenterologist if you have not heard about the biopsies in 3 weeks.  Our staff will call the home number listed on your records the next business day following your procedure to check on you and address any questions or concerns that you may have at that time regarding the information given to you following your procedure. This is a courtesy call and so if there is no answer at  the home number and we have not heard from you through the emergency physician on call, we will assume that you have returned to your regular daily activities without incident.  SIGNATURES/CONFIDENTIALITY: You and/or your care partner have signed paperwork which will be entered into your electronic medical record.  These signatures attest to the fact that that the information above on your After Visit Summary has been reviewed and is understood.  Full responsibility of the confidentiality of this discharge information lies with you  and/or your care-partner.

## 2014-03-30 ENCOUNTER — Telehealth: Payer: Self-pay | Admitting: *Deleted

## 2014-03-30 NOTE — Telephone Encounter (Signed)
Message left

## 2014-04-06 ENCOUNTER — Encounter: Payer: Self-pay | Admitting: Gastroenterology

## 2014-04-22 ENCOUNTER — Telehealth: Payer: Self-pay | Admitting: Gastroenterology

## 2014-04-23 NOTE — Telephone Encounter (Signed)
Pt aware that a letter will be mailed out with results and recommendations I did however, let her know what the letter states.

## 2014-05-07 IMAGING — CR DG CHEST 2V
2 series · 2 of 2 positions shown · non-contrast
Comparison: 02/28/2012.

CLINICAL DATA: Preoperative radiograph.  Endometrial carcinoma.

CHEST - 2 VIEW

[w chest pa]
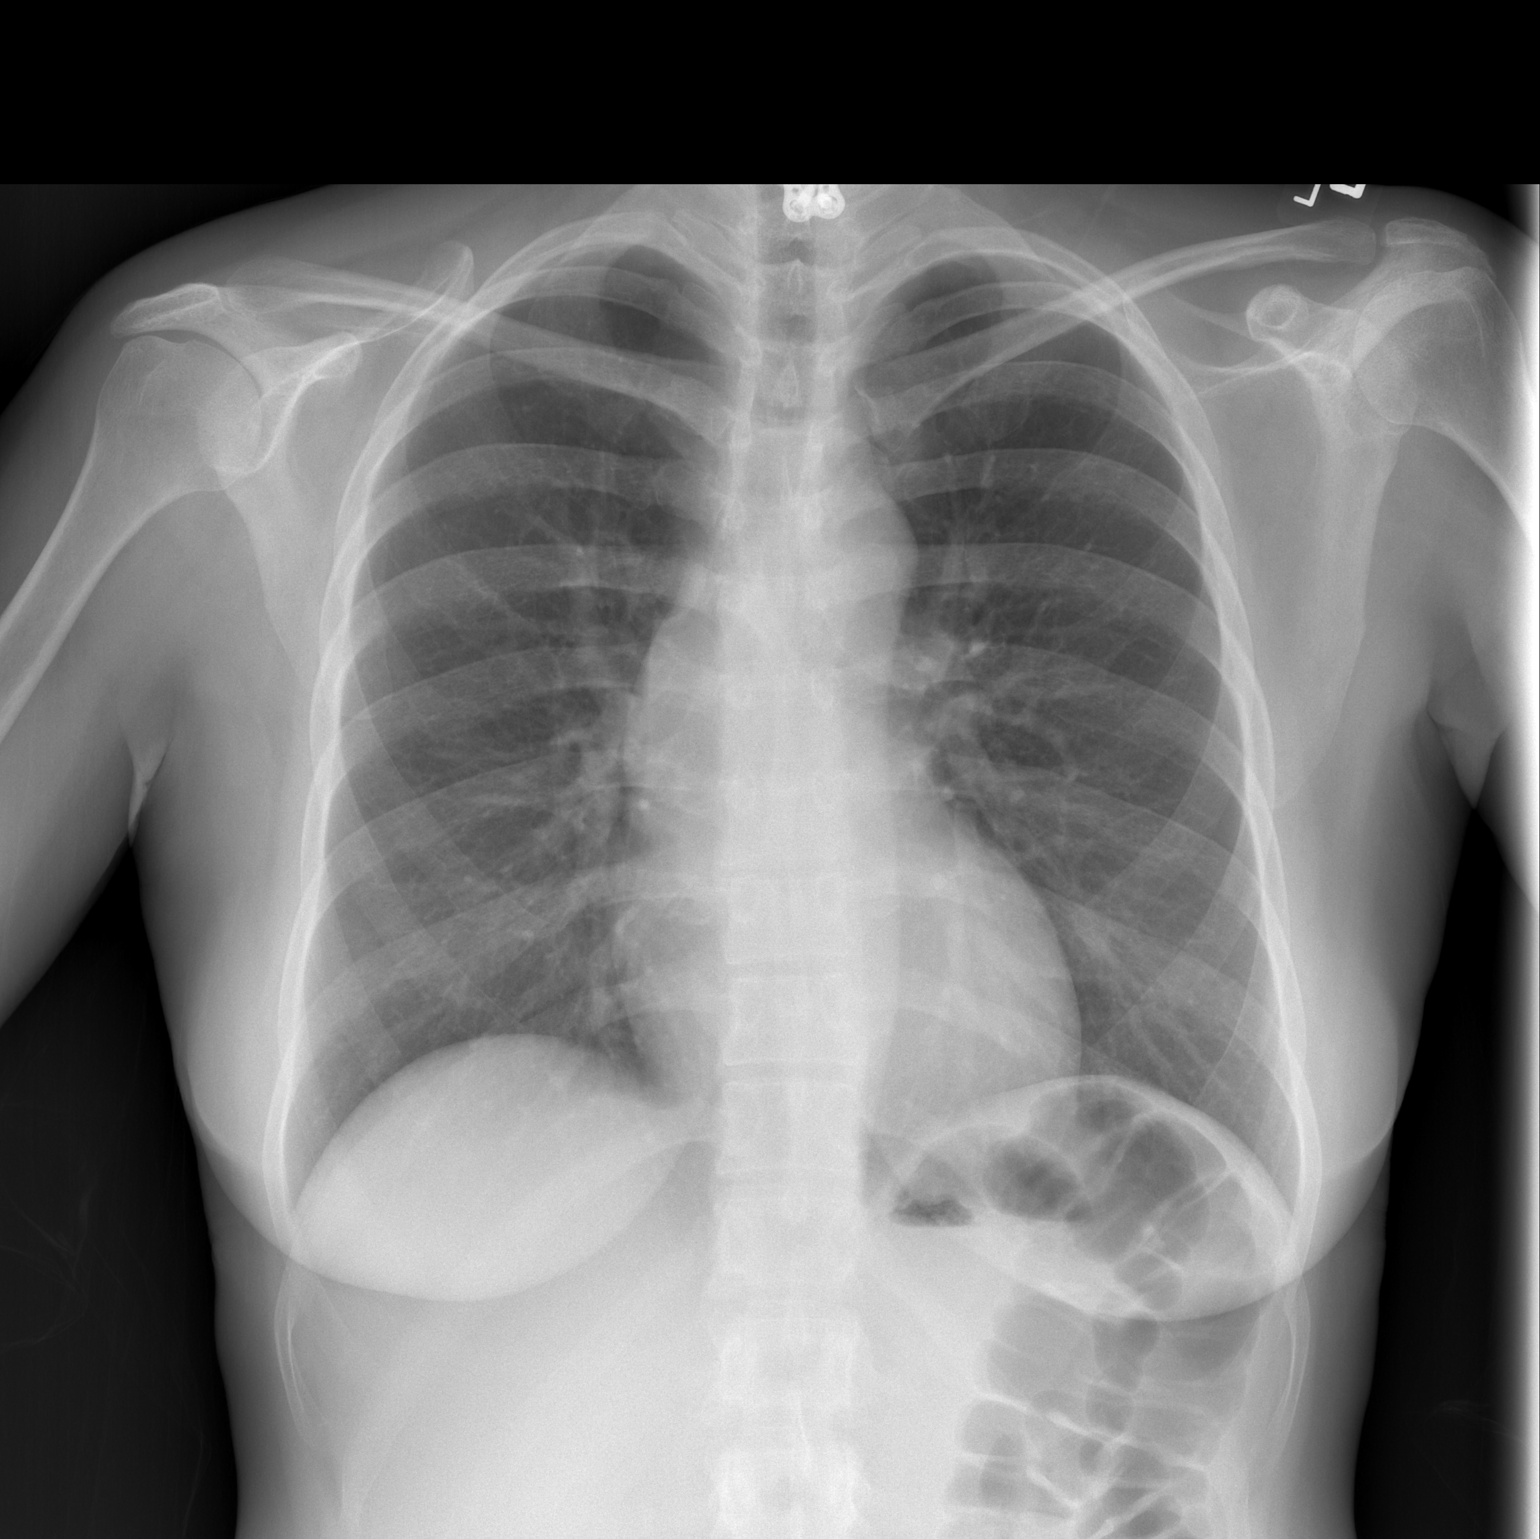

[w chest lat]
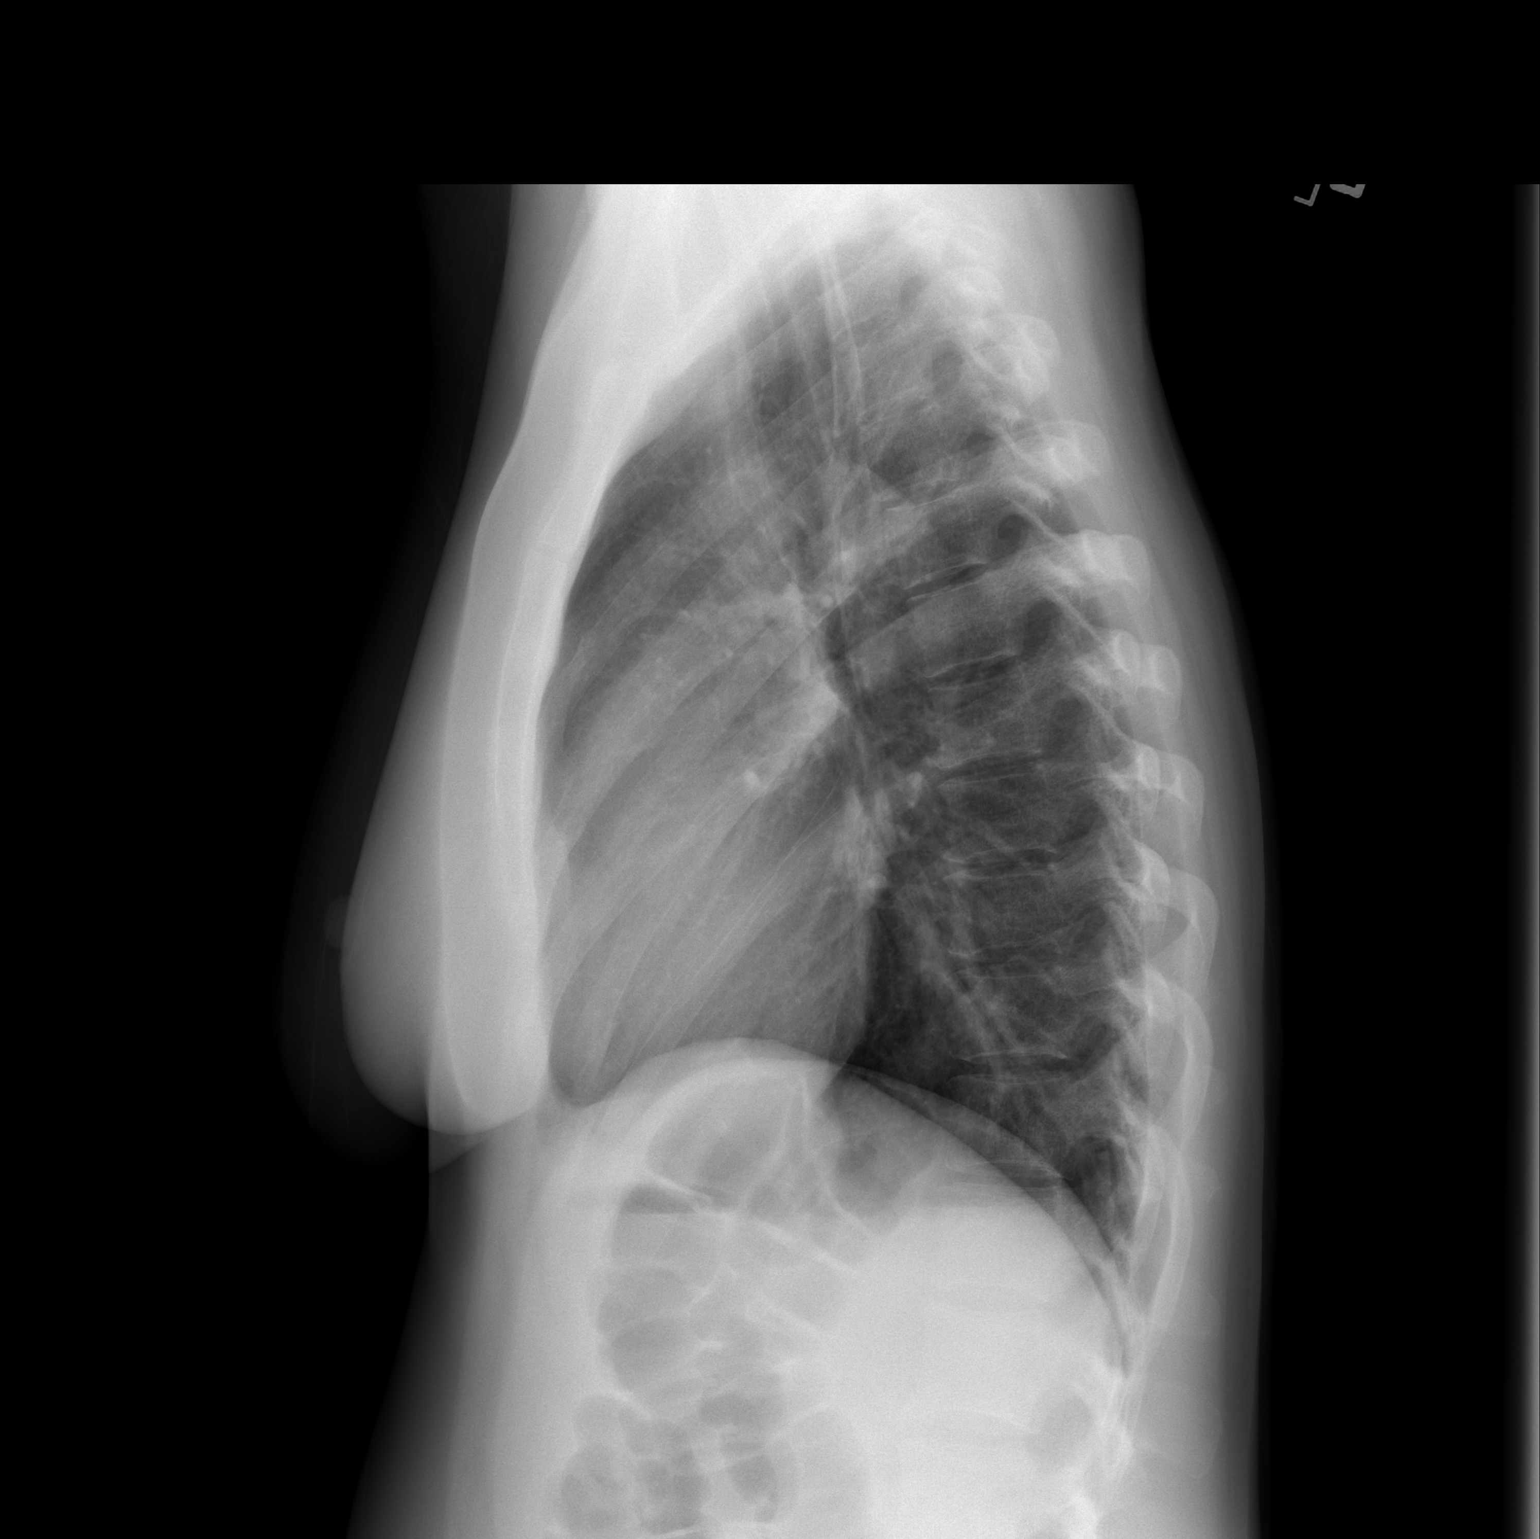

[2 of 2 positions shown; findings below may reference images not displayed]

FINDINGS: Lower cervical ACDF.  Cardiopericardial silhouette within
normal limits. Mediastinal contours normal. Trachea midline.  No
airspace disease or effusion.  Stable appearance of the
cardiomediastinal contours.
IMPRESSION: No active cardiopulmonary disease.

## 2014-12-08 ENCOUNTER — Other Ambulatory Visit: Payer: Self-pay | Admitting: Obstetrics and Gynecology

## 2014-12-10 LAB — CYTOLOGY - PAP

## 2014-12-16 ENCOUNTER — Encounter: Payer: Self-pay | Admitting: Genetic Counselor

## 2014-12-16 DIAGNOSIS — Z1379 Encounter for other screening for genetic and chromosomal anomalies: Secondary | ICD-10-CM | POA: Insufficient documentation

## 2015-03-18 ENCOUNTER — Encounter: Payer: Self-pay | Admitting: Gastroenterology

## 2016-12-03 ENCOUNTER — Encounter: Payer: Self-pay | Admitting: Gastroenterology

## 2017-03-04 ENCOUNTER — Other Ambulatory Visit: Payer: Self-pay | Admitting: Obstetrics and Gynecology

## 2017-03-05 LAB — CYTOLOGY - PAP

## 2017-03-07 ENCOUNTER — Telehealth: Payer: Self-pay | Admitting: Gastroenterology

## 2017-03-07 NOTE — Telephone Encounter (Signed)
Pt has been scheduled for endo colon for recall d/t lynch syndrome

## 2017-04-09 ENCOUNTER — Encounter: Payer: Self-pay | Admitting: Gastroenterology

## 2017-04-09 ENCOUNTER — Ambulatory Visit (AMBULATORY_SURGERY_CENTER): Payer: Self-pay

## 2017-04-09 VITALS — Ht 64.0 in | Wt 125.8 lb

## 2017-04-09 DIAGNOSIS — Z1509 Genetic susceptibility to other malignant neoplasm: Secondary | ICD-10-CM

## 2017-04-09 NOTE — Progress Notes (Signed)
Per pt, no allergies to soy or egg products.Pt not taking any weight loss meds or using  O2 at home.   Pt refused Emmi video. 

## 2017-04-16 ENCOUNTER — Encounter: Payer: Self-pay | Admitting: Gastroenterology

## 2017-04-16 ENCOUNTER — Ambulatory Visit (AMBULATORY_SURGERY_CENTER): Payer: BLUE CROSS/BLUE SHIELD | Admitting: Gastroenterology

## 2017-04-16 VITALS — BP 148/81 | HR 79 | Temp 99.3°F | Resp 29 | Ht 64.0 in | Wt 125.0 lb

## 2017-04-16 DIAGNOSIS — Z86010 Personal history of colonic polyps: Secondary | ICD-10-CM | POA: Diagnosis not present

## 2017-04-16 DIAGNOSIS — K6389 Other specified diseases of intestine: Secondary | ICD-10-CM | POA: Diagnosis not present

## 2017-04-16 DIAGNOSIS — D123 Benign neoplasm of transverse colon: Secondary | ICD-10-CM

## 2017-04-16 DIAGNOSIS — Z1509 Genetic susceptibility to other malignant neoplasm: Secondary | ICD-10-CM

## 2017-04-16 DIAGNOSIS — Z8481 Family history of carrier of genetic disease: Secondary | ICD-10-CM | POA: Diagnosis not present

## 2017-04-16 MED ORDER — SODIUM CHLORIDE 0.9 % IV SOLN: 500.0000 mL | INTRAVENOUS | Status: DC

## 2017-04-16 NOTE — Progress Notes (Signed)
Pt's states no medical or surgical changes since previsit or office visit. 

## 2017-04-16 NOTE — Progress Notes (Signed)
Spontaneous respirations throughout. VSS. Resting comfortably. To PACU on room air. Report to  Celia RN. 

## 2017-04-16 NOTE — Op Note (Signed)
[facility] Patient Name: Olivia Sweeney Procedure Date: 04/16/2017 3:19 PM MRN: [id number] Endoscopist: Milus Banister , MD Age: 51 Referring MD:  Date of Birth: 08/10/1966 Gender: Female Account #: [id number] Procedure:                Upper GI endoscopy Indications:              Screening procedure; genetically proven Lynch                            Syndrome Medicines:                Monitored Anesthesia Care Procedure:                Pre-Anesthesia Assessment:                           - Prior to the procedure, a History and Physical                            was performed, and patient medications and                            allergies were reviewed. The patient's tolerance of                            previous anesthesia was also reviewed. The risks                            and benefits of the procedure and the sedation                            options and risks were discussed with the patient.                            All questions were answered, and informed consent                            was obtained. Prior Anticoagulants: The patient has                            taken no previous anticoagulant or antiplatelet                            agents. ASA Grade Assessment: III - A patient with                            severe systemic disease. After reviewing the risks                            and benefits, the patient was deemed in                            satisfactory condition to undergo the procedure.  After obtaining informed consent, the endoscope was                            passed under direct vision. Throughout the                            procedure, the patient's blood pressure, pulse, and                            oxygen saturations were monitored continuously. The                            Endoscope was introduced through the mouth, and                            advanced to the second part of duodenum. The  upper                            GI endoscopy was accomplished without difficulty.                            The patient tolerated the procedure well. Scope In: Scope Out: Findings:                 The esophagus was normal.                           The stomach was normal.                           The examined duodenum was normal. Complications:            No immediate complications. Estimated blood loss:                            None. Estimated Blood Loss:     Estimated blood loss: none. Impression:               - Normal UGI tract. Recommendation:           - Patient has a contact number available for                            emergencies. The signs and symptoms of potential                            delayed complications were discussed with the                            patient. Return to normal activities tomorrow.                            Written discharge instructions were provided to the                            patient.                           -  Resume previous diet.                           - Continue present medications.                           - Repeat upper endoscopy in 3 years for screening                            purposes. Milus Banister, MD 04/16/2017 3:39:55 PM This report has been signed electronically.

## 2017-04-16 NOTE — Patient Instructions (Signed)
Discharge instructions given. Handout on polyps. Resume previous medications. YOU HAD AN ENDOSCOPIC PROCEDURE TODAY AT THE [facility]:   Refer to the procedure report that was given to you for any specific questions about what was found during the examination.  If the procedure report does not answer your questions, please call your gastroenterologist to clarify.  If you requested that your care partner not be given the details of your procedure findings, then the procedure report has been included in a sealed envelope for you to review at your convenience later.  YOU SHOULD EXPECT: Some feelings of bloating in the abdomen. Passage of more gas than usual.  Walking can help get rid of the air that was put into your GI tract during the procedure and reduce the bloating. If you had a lower endoscopy (such as a colonoscopy or flexible sigmoidoscopy) you may notice spotting of blood in your stool or on the toilet paper. If you underwent a bowel prep for your procedure, you may not have a normal bowel movement for a few days.  Please Note:  You might notice some irritation and congestion in your nose or some drainage.  This is from the oxygen used during your procedure.  There is no need for concern and it should clear up in a day or so.  SYMPTOMS TO REPORT IMMEDIATELY:   Following lower endoscopy (colonoscopy or flexible sigmoidoscopy):  Excessive amounts of blood in the stool  Significant tenderness or worsening of abdominal pains  Swelling of the abdomen that is new, acute  Fever of 100F or higher   Following upper endoscopy (EGD)  Vomiting of blood or coffee ground material  New chest pain or pain under the shoulder blades  Painful or persistently difficult swallowing  New shortness of breath  Fever of 100F or higher  Black, tarry-looking stools  For urgent or emergent issues, a gastroenterologist can be reached at any hour by calling [phone].   DIET:  We do  recommend a small meal at first, but then you may proceed to your regular diet.  Drink plenty of fluids but you should avoid alcoholic beverages for 24 hours.  ACTIVITY:  You should plan to take it easy for the rest of today and you should NOT DRIVE or use heavy machinery until tomorrow (because of the sedation medicines used during the test).    FOLLOW UP: Our staff will call the number listed on your records the next business day following your procedure to check on you and address any questions or concerns that you may have regarding the information given to you following your procedure. If we do not reach you, we will leave a message.  However, if you are feeling well and you are not experiencing any problems, there is no need to return our call.  We will assume that you have returned to your regular daily activities without incident.  If any biopsies were taken you will be contacted by phone or by letter within the next 1-3 weeks.  Please call us at [phone] if you have not heard about the biopsies in 3 weeks.    SIGNATURES/CONFIDENTIALITY: You and/or your care partner have signed paperwork which will be entered into your electronic medical record.  These signatures attest to the fact that that the information above on your After Visit Summary has been reviewed and is understood.  Full responsibility of the confidentiality of this discharge information lies with you and/or your care-partner. 

## 2017-04-16 NOTE — Op Note (Signed)
[facility] Patient Name: Olivia Sweeney Procedure Date: 04/16/2017 3:19 PM MRN: [id number] Endoscopist: Milus Banister , MD Age: 51 Referring MD:  Date of Birth: 1966/06/12 Gender: Female Account #: [id number] Procedure:                Colonoscopy Indications:              Lynch Syndrome, genetically proven; colonoscopy                            2015 two subCM adenomas (she was recommended to                            have repeat exam in 1 year) Medicines:                Monitored Anesthesia Care Procedure:                Pre-Anesthesia Assessment:                           - Prior to the procedure, a History and Physical                            was performed, and patient medications and                            allergies were reviewed. The patient's tolerance of                            previous anesthesia was also reviewed. The risks                            and benefits of the procedure and the sedation                            options and risks were discussed with the patient.                            All questions were answered, and informed consent                            was obtained. Prior Anticoagulants: The patient has                            taken no previous anticoagulant or antiplatelet                            agents. ASA Grade Assessment: II - A patient with                            mild systemic disease. After reviewing the risks                            and benefits, the patient was deemed in  satisfactory condition to undergo the procedure.                           After obtaining informed consent, the colonoscope                            was passed under direct vision. Throughout the                            procedure, the patient's blood pressure, pulse, and                            oxygen saturations were monitored continuously. The                            Colonoscope was introduced  through the anus and                            advanced to the the cecum, identified by                            appendiceal orifice and ileocecal valve. The                            colonoscopy was performed without difficulty. The                            patient tolerated the procedure well. The quality                            of the bowel preparation was excellent. The                            ileocecal valve, appendiceal orifice, and rectum                            were photographed. Scope In: 3:22:29 PM Scope Out: 3:31:41 PM Scope Withdrawal Time: 0 hours 6 minutes 48 seconds  Total Procedure Duration: 0 hours 9 minutes 12 seconds  Findings:                 A 4 mm polyp was found in the transverse colon. The                            polyp was sessile. The polyp was removed with a                            cold snare. Resection and retrieval were complete.                           The exam was otherwise without abnormality on                            direct and retroflexion views. Complications:  No immediate complications. Estimated blood loss:                            None. Estimated Blood Loss:     Estimated blood loss: none. Impression:               - One 4 mm polyp in the transverse colon, removed                            with a cold snare. Resected and retrieved.                           - The examination was otherwise normal on direct                            and retroflexion views. Recommendation:           - Patient has a contact number available for                            emergencies. The signs and symptoms of potential                            delayed complications were discussed with the                            patient. Return to normal activities tomorrow.                            Written discharge instructions were provided to the                            patient.                           - Repeat colonoscopy is  recommended. The                            colonoscopy date will be determined after pathology                            results from today's exam become available for                            review. Likely in 1 YEAR given your Lynch Syndrome.                           - Resume previous diet.                           - Continue present medications. Milus Banister, MD 04/16/2017 3:34:42 PM This report has been signed electronically.

## 2017-04-16 NOTE — Progress Notes (Signed)
Called to room to assist during endoscopic procedure.  Patient ID and intended procedure confirmed with present staff. Received instructions for my participation in the procedure from the performing physician.  

## 2017-04-17 ENCOUNTER — Telehealth: Payer: Self-pay | Admitting: *Deleted

## 2017-04-17 NOTE — Telephone Encounter (Signed)
No answer, left message to call if questions or concerns. 

## 2017-04-17 NOTE — Telephone Encounter (Signed)
No answer for follow up call, left message for patient to call with questions or concerns. SM

## 2017-04-28 ENCOUNTER — Encounter: Payer: Self-pay | Admitting: Gastroenterology

## 2017-05-16 ENCOUNTER — Other Ambulatory Visit: Payer: Self-pay | Admitting: General Surgery

## 2017-06-07 ENCOUNTER — Encounter: Payer: Self-pay | Admitting: Oncology

## 2017-06-18 ENCOUNTER — Ambulatory Visit (HOSPITAL_BASED_OUTPATIENT_CLINIC_OR_DEPARTMENT_OTHER): Payer: BLUE CROSS/BLUE SHIELD | Admitting: Oncology

## 2017-06-18 ENCOUNTER — Encounter (INDEPENDENT_AMBULATORY_CARE_PROVIDER_SITE_OTHER): Payer: Self-pay

## 2017-06-18 ENCOUNTER — Telehealth: Payer: Self-pay | Admitting: Oncology

## 2017-06-18 VITALS — BP 148/72 | HR 109 | Temp 99.0°F | Resp 17 | Ht 64.0 in | Wt 131.9 lb

## 2017-06-18 DIAGNOSIS — Z8589 Personal history of malignant neoplasm of other organs and systems: Secondary | ICD-10-CM | POA: Diagnosis not present

## 2017-06-18 DIAGNOSIS — Z1509 Genetic susceptibility to other malignant neoplasm: Secondary | ICD-10-CM

## 2017-06-18 DIAGNOSIS — C44599 Other specified malignant neoplasm of skin of other part of trunk: Secondary | ICD-10-CM

## 2017-06-18 DIAGNOSIS — Z8041 Family history of malignant neoplasm of ovary: Secondary | ICD-10-CM

## 2017-06-18 DIAGNOSIS — Z808 Family history of malignant neoplasm of other organs or systems: Secondary | ICD-10-CM | POA: Diagnosis not present

## 2017-06-18 DIAGNOSIS — C541 Malignant neoplasm of endometrium: Secondary | ICD-10-CM

## 2017-06-18 DIAGNOSIS — Z8051 Family history of malignant neoplasm of kidney: Secondary | ICD-10-CM

## 2017-06-18 NOTE — Telephone Encounter (Signed)
Gave patient avs report and appointments for September 2018 and February 2019

## 2017-06-18 NOTE — Progress Notes (Signed)
Reason for Referral: Sebaceous carcinoma.   HPI: 51 year old woman currently of [place] where she lived for over 30 years. She has a past medical history significant for uterine cancer diagnosed in 2014. She underwent hysterectomy and bilateral oophorectomy on 03/17/2013 completed by Dr. Alycia Rossetti.  The pathology showed endometrial carcinoma grade 1 arising from atypical hyperplasia and superficially invading into the myometrium. No lymph nodes involved with 0 out of 8 sampled she would malignancy. The tumor showed expression of MSH2 an MSH6 at that time. Based on this pathology patient was evaluated by Roma Kayser for genetic testing and found to have an MSH2 mutation confirming the presence of Lynch syndrome. She has been getting routine pelvic examination and routine Pap smears as well as periodic endoscopies and colonoscopy. Her most recent scope was done by Dr. Ardis Hughs on 04/16/2017.  She has reported a raised skin lesion on her back that she has noticed for years but has been growing as of late. She was referred by her OB/GYN to Dr. Zella Richer and underwent a wide excision performed on 05/16/2017. 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 The deep margin is uninvolved by carcinoma. Clinically, she reports she is feeling well and have healed well at this time. She has resumed most activities of daily living. She denies any chest pain or discomfort. She does not report any lymphadenopathy.  She does not report any headaches, blurry vision, syncope or seizures. She does not report any fevers or chills or sweats. She does not report any cough, wheezing or hemoptysis. She does not report any nausea, vomiting or abdominal pain. She does not report any constipation or diarrhea. She does not report any chest pain, palpitation or leg edema. She does not report any frequency urgency or hesitancy. She does not report any hematuria or dysuria. Remaining review of system is  unremarkable.   Past Medical History:  Diagnosis Date  . Anxiety    Very upset due to loss of ex- husband in Feb 2018  . Diabetes mellitus without complication (HCC)    diet controlled  . Lynch syndrome 2014  . Uterine cancer ([place]) 2014  :  Past Surgical History:  Procedure Laterality Date  . [place]   looking for fibroids  . ANTERIOR CERVICAL DECOMP/DISCECTOMY FUSION  03/06/2012   Procedure: ANTERIOR CERVICAL DECOMPRESSION/DISCECTOMY FUSION 1 LEVEL;  Surgeon: Sinclair Ship, MD;  Location: [facility];  Service: Orthopedics;  Laterality: Left;  Anterior cervical decompression fusion, cervical 5-6, cervical 6-7 with instrumentation and allograft.  . cataracts    . dental implant     also had soft tissue graft after dental implant   . HYSTEROSCOPY WITH NOVASURE N/A 02/09/2013   Procedure: HYSTEROSCOPY WITH NOVASURE;  Surgeon: Olga Millers, MD;  Location: [facility];  Service: Gynecology;  Laterality: N/A;  . LYMPH NODE DISSECTION Bilateral 03/17/2013   Procedure: LYMPH NODE DISSECTION;  Surgeon: Imagene Gurney A. Alycia Rossetti, MD;  Location: [facility];  Service: Gynecology;  Laterality: Bilateral;  . ROBOTIC ASSISTED TOTAL HYSTERECTOMY WITH BILATERAL SALPINGO OOPHERECTOMY N/A 03/17/2013   Procedure: ROBOTIC ASSISTED TOTAL HYSTERECTOMY WITH BILATERAL SALPINGO OOPHORECTOMY;  Surgeon: Imagene Gurney A. Alycia Rossetti, MD;  Location: [facility];  Service: Gynecology;  Laterality: N/A;  :  No current outpatient prescriptions on file.  Current Facility-Administered Medications:  .  0.9 %  sodium chloride infusion, 500 mL, Intravenous, Continuous, Milus Banister, MD:  Allergies  Allergen Reactions  . Bee Venom Shortness Of Breath and Swelling  .  Other Other (See Comments)    Cats; Reaction: sneezing, etc  :  Family History  Problem Relation Age of Onset  . Breast cancer Mother        dx in her late 40s-early 46s  . Uterine cancer Mother        dx in her 5s  . Kidney cancer Mother        dx  in her 58s  . Ovarian cancer Maternal Aunt 48  . Uterine cancer Maternal Grandmother        dx in her 2s  . Uterine cancer Other   :  Social History   Social History  . Marital status: Widowed    Spouse name: N/A  . Number of children: 0  . Years of education: N/A   Occupational History  . Office Manager [employer]   Social History Main Topics  . Smoking status: Never Smoker  . Smokeless tobacco: Never Used  . Alcohol use Yes     Comment: 14-21 drinks in a week.  . Drug use: No  . Sexual activity: Not on file   Other Topics Concern  . Not on file   Social History Narrative  . No narrative on file  :  Pertinent items are noted in HPI.  Exam: Blood pressure (!) 148/72, pulse (!) 109, temperature 99 F (37.2 C), temperature source Oral, resp. rate 17, height 5' 4"  (1.626 m), weight 131 lb 14.4 oz (59.8 kg), last menstrual period 02/16/2013, SpO2 100 %.  ECOG 0 General appearance: alert and cooperative appeared without distress. Throat: No oral thrush or ulcers. Neck: no adenopathy Back: negative Resp: clear to auscultation bilaterally Cardio: regular rate and rhythm, S1, S2 normal, no murmur, click, rub or gallop GI: soft, non-tender; bowel sounds normal; no masses,  no organomegaly Extremities: extremities normal, atraumatic, no cyanosis or edema Pulses: 2+ and symmetric Skin: No rashes or lesions. Well-healed scar noted on her back extending into the right midaxillary line. Lymph nodes: Cervical, supraclavicular, and axillary nodes normal.  CBC    Component Value Date/Time   WBC 9.6 03/18/2013 0410   RBC 3.51 (L) 03/18/2013 0410   HGB 10.7 (L) 03/18/2013 0410   HCT 33.0 (L) 03/18/2013 0410   PLT 204 03/18/2013 0410   MCV 94.0 03/18/2013 0410   MCH 30.5 03/18/2013 0410   MCHC 32.4 03/18/2013 0410   RDW 14.2 03/18/2013 0410   LYMPHSABS 1.5 03/06/2013 1145   MONOABS 0.7 03/06/2013 1145   EOSABS 0.1 03/06/2013 1145   BASOSABS 0.0 03/06/2013 1145      Assessment and Plan:   51 year old woman with the following issues:  1. 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 She had a lesion noted on the lower aspect of back on the right side that has been growing over a period of years. She underwent surgical excision performed by Dr. Zella Richer on 05/16/2017. The final pathology confirmed the presence of sebaceous carcinoma measuring 8.3 cm with negative margins.  The natural course of this disease was discussed today with the patient and her friends son accompanied her today. She does not have any clinical signs or symptoms of local regional metastasis. No regional lymphadenopathy palpated at this time. I do not believe she will require any systemic therapy at this time unless he develops metastatic disease. CT scan of the chest to rule out any local regional involvement would be important at this time.  The role of adjuvant radiation therapy given the size of the tumor was  discussed today. Her case will be discussed in the multidisciplinary tumor board and radiation oncology opinion and referral might be needed depending on the consensus.  2. Lynch syndrome: This has been confirmed by genetic testing testing in 2014 after she was diagnosed with endometrial cancer. She was found to have MSH2. 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 Her mother has the same condition and have had multiple skin cancers in the past.  The implication of this diagnosis was discussed today with the patient. Continuous aggressive cancer screening is indicated at this time. She is already getting colonoscopy and endoscopy on a regular basis. She is status post hysterectomy and oophorectomy. She will require frequent dermatology follow-up for skin cancer screening. I also advised her that she will require annual urine analysis to screen for hematuria and possible endometrial and kidney cancer.  I  will refer her back to [facility] for continuous counseling.   3. History of endometrial cancer: Appeared to be an early-stage without any evidence of recurrence at this time.  4. Age-appropriate cancer screening: This was discussed previously and she is up-to-date also on her mammography.  5. Follow-up: Will be in 6 months to check on her status I will assess imaging studies showed any evidence of relapse.

## 2017-06-26 ENCOUNTER — Telehealth: Payer: Self-pay | Admitting: *Deleted

## 2017-06-26 NOTE — Telephone Encounter (Signed)
Pt called to see if she is going to be receiving Radiation.

## 2017-07-01 NOTE — Telephone Encounter (Signed)
No radiation is needed.

## 2017-07-02 ENCOUNTER — Encounter (HOSPITAL_COMMUNITY): Payer: Self-pay

## 2017-07-02 ENCOUNTER — Ambulatory Visit (HOSPITAL_COMMUNITY)
Admission: RE | Admit: 2017-07-02 | Discharge: 2017-07-02 | Disposition: A | Discharge status: Home / Self Care | Payer: BLUE CROSS/BLUE SHIELD | Source: Ambulatory Visit | Attending: Oncology | Admitting: Oncology

## 2017-07-02 ENCOUNTER — Telehealth: Payer: Self-pay | Admitting: *Deleted

## 2017-07-02 DIAGNOSIS — C541 Malignant neoplasm of endometrium: Secondary | ICD-10-CM | POA: Insufficient documentation

## 2017-07-02 DIAGNOSIS — R911 Solitary pulmonary nodule: Secondary | ICD-10-CM | POA: Insufficient documentation

## 2017-07-02 DIAGNOSIS — Z1509 Genetic susceptibility to other malignant neoplasm: Secondary | ICD-10-CM | POA: Insufficient documentation

## 2017-07-02 NOTE — Telephone Encounter (Signed)
LM that Dr Alen Blew states she does not need radiation

## 2017-07-03 ENCOUNTER — Telehealth: Payer: Self-pay | Admitting: *Deleted

## 2017-07-03 NOTE — Telephone Encounter (Signed)
-----   Message from Wyatt Portela, MD sent at 07/03/2017  9:00 AM EDT ----- Please let her know her CT showed no cancer.

## 2017-07-03 NOTE — Telephone Encounter (Signed)
As noted below by Dr. Alen Blew, I informed patient that her CT scan showed no cancer. She verbalized understanding.

## 2017-07-08 ENCOUNTER — Encounter: Payer: BLUE CROSS/BLUE SHIELD | Admitting: Genetic Counselor

## 2017-07-08 ENCOUNTER — Other Ambulatory Visit: Payer: BLUE CROSS/BLUE SHIELD

## 2017-07-22 ENCOUNTER — Encounter: Payer: Self-pay | Admitting: *Deleted

## 2017-12-17 ENCOUNTER — Ambulatory Visit: Payer: BLUE CROSS/BLUE SHIELD | Admitting: Oncology

## 2018-01-03 ENCOUNTER — Ambulatory Visit: Payer: BLUE CROSS/BLUE SHIELD | Admitting: Oncology

## 2018-01-22 ENCOUNTER — Telehealth: Payer: Self-pay

## 2018-01-22 ENCOUNTER — Inpatient Hospital Stay: Payer: BLUE CROSS/BLUE SHIELD | Attending: Oncology | Admitting: Oncology

## 2018-01-22 VITALS — BP 186/89 | HR 93 | Temp 99.0°F | Resp 20 | Wt 136.0 lb

## 2018-01-22 DIAGNOSIS — C541 Malignant neoplasm of endometrium: Secondary | ICD-10-CM

## 2018-01-22 DIAGNOSIS — Z8542 Personal history of malignant neoplasm of other parts of uterus: Secondary | ICD-10-CM | POA: Insufficient documentation

## 2018-01-22 DIAGNOSIS — Z90722 Acquired absence of ovaries, bilateral: Secondary | ICD-10-CM | POA: Diagnosis not present

## 2018-01-22 DIAGNOSIS — Z1509 Genetic susceptibility to other malignant neoplasm: Secondary | ICD-10-CM | POA: Diagnosis not present

## 2018-01-22 DIAGNOSIS — Z90710 Acquired absence of both cervix and uterus: Secondary | ICD-10-CM | POA: Insufficient documentation

## 2018-01-22 DIAGNOSIS — Z85828 Personal history of other malignant neoplasm of skin: Secondary | ICD-10-CM | POA: Insufficient documentation

## 2018-01-22 NOTE — Telephone Encounter (Signed)
Printed avs and calender of upcoming appointment. Per 3/27 los 

## 2018-01-22 NOTE — Progress Notes (Signed)
Hematology and Oncology Follow Up Visit  ANYSIA Sweeney [id number] May 16, 1966 52 y.o. 01/22/2018 3:52 PM Patient, No Pcp PerNo ref. provider found   Principle Diagnosis: 52 year old woman diagnosed with sebaceous carcinoma diagnosed in July 2018.  She presented with a lesion on the right side of lower back.   Prior Therapy: She underwent surgical excision performed by Dr. Zella Richer on 05/16/2017. The final pathology confirmed the presence of sebaceous carcinoma measuring 8.3 cm with negative margins.     Current therapy: Active surveillance.  Interim History: Olivia Sweeney presents today for a follow-up visit.  She reports no recent changes since the last visit.  Her wound has fully healed and no skin lesions reported.  She remains active and attends to activities of daily living without Olivia decline in her performance status or energy.  Appetite and weight remains stable.  She does not report Olivia headaches, blurry vision, syncope or seizures. Does not report Olivia fevers, chills or sweats.  Does not report Olivia cough, wheezing or hemoptysis.  Does not report Olivia chest pain, palpitation, orthopnea or leg edema.  Does not report Olivia nausea, vomiting or abdominal pain.  Does not report Olivia constipation or diarrhea.  Does not report Olivia skeletal complaints.    Does not report frequency, urgency or hematuria.  Does not report Olivia skin rashes or lesions. Does not report Olivia heat or cold intolerance.  Does not report Olivia lymphadenopathy or petechiae.  Does not report Olivia anxiety or depression.  Remaining review of systems is negative.    Medications: I have reviewed the patient's current medications.  No current outpatient medications on file.   Current Facility-Administered Medications  Medication Dose Route Frequency Provider Last Rate Last Dose  . 0.9 %  sodium chloride infusion  500 mL Intravenous Continuous Milus Banister, MD         Allergies:  Allergies  Allergen Reactions  . Bee  Venom Shortness Of Breath and Swelling  . Other Other (See Comments)    Cats; Reaction: sneezing, etc    Past Medical History, Surgical history, Social history, and Family History were reviewed and updated.    Physical Exam: Blood pressure (!) 186/89, pulse 93, temperature 99 F (37.2 C), temperature source Oral, resp. rate 20, weight 136 lb (61.7 kg), last menstrual period 02/16/2013, SpO2 100 %.   ECOG: 0 General appearance: alert and cooperative appeared without distress. Head: Normocephalic, without obvious abnormality Oropharynx: No oral thrush or ulcers. Eyes: No scleral icterus.  Pupils are equal and round reactive to light. Lymph nodes: Cervical, supraclavicular, and axillary nodes normal. Heart:regular rate and rhythm, S1, S2 normal, no murmur, click, rub or gallop Lung:chest clear, no wheezing, rales, normal symmetric air entry Abdomin: soft, non-tender, without masses or organomegaly. Neurological: No motor, sensory deficits.  Intact deep tendon reflexes. Skin: No rashes or lesions.  No ecchymosis or petechiae. Musculoskeletal: No joint deformity or effusion. Psychiatric: Mood and affect are appropriate.    Lab Results: Lab Results  Component Value Date   WBC 9.6 03/18/2013   HGB 10.7 (L) 03/18/2013   HCT 33.0 (L) 03/18/2013   MCV 94.0 03/18/2013   PLT 204 03/18/2013     Chemistry      Component Value Date/Time   NA 131 (L) 03/18/2013 0410   K 4.8 03/18/2013 0410   CL 99 03/18/2013 0410   CO2 25 03/18/2013 0410   BUN 14 03/18/2013 0410   CREATININE 1.10 03/18/2013 0410   CREATININE 0.62 01/01/2012 1205  Component Value Date/Time   CALCIUM 7.7 (L) 03/18/2013 0410   ALKPHOS 58 03/06/2013 1145   AST 18 03/06/2013 1145   ALT 14 03/06/2013 1145   BILITOT 0.3 03/06/2013 1145      Impression and Plan:  52 year old woman with:   1. Sebaceous carcinoma that was surgically resected on May 16, 2017.  Tumor was measuring 8.3 cm with negative  margins.  She is currently on active surveillance without Olivia evidence of recurrent disease.  Physical examination today does not reveal Olivia evidence of recurrent disease.  The plan is to continue with active surveillance with repeat physical exam in 1 year.  2. Lynch syndrome: She was found to have MSH2 in 2014 after being diagnosed with endometrial cancer.   She appears to have Muir-Torre variant of Lynch syndrome given her sebaceous carcinoma.  I recommended continuous dermatological surveillance given her high risk of developing other cutaneous cancers.  She is also up-to-date on screening for other malignancies.  She continues to get endoscopies and colonoscopy and she is status post hysterectomy and oophorectomy.  I offered her a referral to dermatology which she declined.  She said that she will call make the appointment herself.  3. History of endometrial cancer: No evidence of relapse at this time.  4. Age-appropriate cancer screening: She remains up-to-date including annual colonoscopy and periodic endoscopies.  5. Follow-up: Will be in 12 months.   15  minutes was spent with the patient face-to-face today.  More than 50% of time was dedicated to patient counseling, education and coordination of her care.   Zola Button, MD 3/27/20193:52 PM

## 2018-03-20 ENCOUNTER — Encounter: Payer: Self-pay | Admitting: Gastroenterology

## 2019-01-01 ENCOUNTER — Telehealth: Payer: Self-pay | Admitting: Oncology

## 2019-01-01 NOTE — Telephone Encounter (Signed)
Call day moved appt from 03/25 to 04/07. Called patient to confirm and left a vm. Schedule mailed.

## 2019-01-21 ENCOUNTER — Ambulatory Visit: Payer: BLUE CROSS/BLUE SHIELD | Admitting: Oncology

## 2019-01-27 ENCOUNTER — Telehealth: Payer: Self-pay | Admitting: Oncology

## 2019-01-27 NOTE — Telephone Encounter (Signed)
R/s appt per 3/30 sch message - rescheduled appt due to covid19 - unable to reach patient - sent letter in the mail with appt date and time and left message

## 2019-02-03 ENCOUNTER — Ambulatory Visit: Payer: BLUE CROSS/BLUE SHIELD | Admitting: Oncology

## 2019-08-06 ENCOUNTER — Inpatient Hospital Stay: Payer: BLUE CROSS/BLUE SHIELD | Attending: Oncology | Admitting: Oncology

## 2019-08-10 ENCOUNTER — Telehealth: Payer: Self-pay | Admitting: Oncology

## 2019-08-10 NOTE — Telephone Encounter (Signed)
Called pt per 10/8 sch message - no answer - left message for patient to call back to reschedule missed appt .

## 2019-10-05 ENCOUNTER — Encounter (HOSPITAL_COMMUNITY): Payer: Self-pay

## 2019-10-05 ENCOUNTER — Other Ambulatory Visit: Payer: Self-pay

## 2019-10-05 ENCOUNTER — Emergency Department (HOSPITAL_COMMUNITY): Payer: Self-pay

## 2019-10-05 ENCOUNTER — Inpatient Hospital Stay (HOSPITAL_COMMUNITY)
Admission: EM | Admit: 2019-10-05 | Discharge: 2019-10-11 | DRG: 683 | Disposition: A | Discharge status: Home / Self Care | Payer: Self-pay | Attending: Internal Medicine | Admitting: Internal Medicine

## 2019-10-05 ENCOUNTER — Observation Stay (HOSPITAL_COMMUNITY): Payer: Self-pay

## 2019-10-05 DIAGNOSIS — R627 Adult failure to thrive: Secondary | ICD-10-CM | POA: Diagnosis present

## 2019-10-05 DIAGNOSIS — E1122 Type 2 diabetes mellitus with diabetic chronic kidney disease: Secondary | ICD-10-CM | POA: Diagnosis present

## 2019-10-05 DIAGNOSIS — E876 Hypokalemia: Secondary | ICD-10-CM | POA: Diagnosis present

## 2019-10-05 DIAGNOSIS — R630 Anorexia: Secondary | ICD-10-CM | POA: Diagnosis present

## 2019-10-05 DIAGNOSIS — Z1509 Genetic susceptibility to other malignant neoplasm: Secondary | ICD-10-CM | POA: Diagnosis present

## 2019-10-05 DIAGNOSIS — Z8049 Family history of malignant neoplasm of other genital organs: Secondary | ICD-10-CM

## 2019-10-05 DIAGNOSIS — R32 Unspecified urinary incontinence: Secondary | ICD-10-CM | POA: Diagnosis present

## 2019-10-05 DIAGNOSIS — N182 Chronic kidney disease, stage 2 (mild): Secondary | ICD-10-CM | POA: Diagnosis present

## 2019-10-05 DIAGNOSIS — E119 Type 2 diabetes mellitus without complications: Secondary | ICD-10-CM

## 2019-10-05 DIAGNOSIS — Z91030 Bee allergy status: Secondary | ICD-10-CM

## 2019-10-05 DIAGNOSIS — N179 Acute kidney failure, unspecified: Principal | ICD-10-CM | POA: Diagnosis present

## 2019-10-05 DIAGNOSIS — E860 Dehydration: Secondary | ICD-10-CM | POA: Diagnosis present

## 2019-10-05 DIAGNOSIS — E1136 Type 2 diabetes mellitus with diabetic cataract: Secondary | ICD-10-CM | POA: Diagnosis present

## 2019-10-05 DIAGNOSIS — E8729 Other acidosis: Secondary | ICD-10-CM | POA: Diagnosis present

## 2019-10-05 DIAGNOSIS — Z981 Arthrodesis status: Secondary | ICD-10-CM

## 2019-10-05 DIAGNOSIS — F4323 Adjustment disorder with mixed anxiety and depressed mood: Secondary | ICD-10-CM | POA: Diagnosis present

## 2019-10-05 DIAGNOSIS — F1010 Alcohol abuse, uncomplicated: Secondary | ICD-10-CM | POA: Diagnosis present

## 2019-10-05 DIAGNOSIS — E1151 Type 2 diabetes mellitus with diabetic peripheral angiopathy without gangrene: Secondary | ICD-10-CM | POA: Diagnosis present

## 2019-10-05 DIAGNOSIS — Z560 Unemployment, unspecified: Secondary | ICD-10-CM

## 2019-10-05 DIAGNOSIS — R945 Abnormal results of liver function studies: Secondary | ICD-10-CM | POA: Diagnosis present

## 2019-10-05 DIAGNOSIS — N390 Urinary tract infection, site not specified: Secondary | ICD-10-CM | POA: Diagnosis present

## 2019-10-05 DIAGNOSIS — Z681 Body mass index (BMI) 19 or less, adult: Secondary | ICD-10-CM

## 2019-10-05 DIAGNOSIS — Z8542 Personal history of malignant neoplasm of other parts of uterus: Secondary | ICD-10-CM

## 2019-10-05 DIAGNOSIS — E872 Acidosis: Secondary | ICD-10-CM | POA: Diagnosis present

## 2019-10-05 DIAGNOSIS — K8020 Calculus of gallbladder without cholecystitis without obstruction: Secondary | ICD-10-CM | POA: Diagnosis present

## 2019-10-05 DIAGNOSIS — M6282 Rhabdomyolysis: Secondary | ICD-10-CM | POA: Diagnosis present

## 2019-10-05 DIAGNOSIS — Z20828 Contact with and (suspected) exposure to other viral communicable diseases: Secondary | ICD-10-CM | POA: Diagnosis present

## 2019-10-05 DIAGNOSIS — R531 Weakness: Secondary | ICD-10-CM

## 2019-10-05 HISTORY — DX: Alcohol abuse, uncomplicated: F10.10

## 2019-10-05 HISTORY — DX: Calculus of gallbladder without cholecystitis without obstruction: K80.20

## 2019-10-05 LAB — COMPREHENSIVE METABOLIC PANEL
ALT: 43 U/L (ref 0–44)
AST: 117 U/L — ABNORMAL HIGH (ref 15–41)
Albumin: 3.9 g/dL (ref 3.5–5.0)
Alkaline Phosphatase: 47 U/L (ref 38–126)
Anion gap: 25 — ABNORMAL HIGH (ref 5–15)
BUN: 25 mg/dL — ABNORMAL HIGH (ref 6–20)
CO2: 12 mmol/L — ABNORMAL LOW (ref 22–32)
Calcium: 9.3 mg/dL (ref 8.9–10.3)
Chloride: 96 mmol/L — ABNORMAL LOW (ref 98–111)
Creatinine, Ser: 1.76 mg/dL — ABNORMAL HIGH (ref 0.44–1.00)
GFR calc Af Amer: 37 mL/min — ABNORMAL LOW (ref >60)
GFR calc non Af Amer: 32 mL/min — ABNORMAL LOW (ref >60)
Glucose, Bld: 122 mg/dL — ABNORMAL HIGH (ref 70–99)
Potassium: 4 mmol/L (ref 3.5–5.1)
Sodium: 133 mmol/L — ABNORMAL LOW (ref 135–145)
Total Bilirubin: 2.2 mg/dL — ABNORMAL HIGH (ref 0.3–1.2)
Total Protein: 7.1 g/dL (ref 6.5–8.1)

## 2019-10-05 LAB — POCT I-STAT EG7
Acid-base deficit: 13 mmol/L — ABNORMAL HIGH (ref 0.0–2.0)
Bicarbonate: 11.9 mmol/L — ABNORMAL LOW (ref 20.0–28.0)
Calcium, Ion: 1.25 mmol/L (ref 1.15–1.40)
HCT: 51 % — ABNORMAL HIGH (ref 36.0–46.0)
Hemoglobin: 17.3 g/dL — ABNORMAL HIGH (ref 12.0–15.0)
O2 Saturation: 93 %
Potassium: 3.1 mmol/L — ABNORMAL LOW (ref 3.5–5.1)
Sodium: 135 mmol/L (ref 135–145)
TCO2: 13 mmol/L — ABNORMAL LOW (ref 22–32)
pCO2, Ven: 25.8 mmHg — ABNORMAL LOW (ref 44.0–60.0)
pH, Ven: 7.27 (ref 7.250–7.430)
pO2, Ven: 74 mmHg — ABNORMAL HIGH (ref 32.0–45.0)

## 2019-10-05 LAB — CBC WITH DIFFERENTIAL/PLATELET
Abs Immature Granulocytes: 0.04 10*3/uL (ref 0.00–0.07)
Basophils Absolute: 0 10*3/uL (ref 0.0–0.1)
Basophils Relative: 0 %
Eosinophils Absolute: 0 10*3/uL (ref 0.0–0.5)
Eosinophils Relative: 0 %
HCT: 38.4 % (ref 36.0–46.0)
Hemoglobin: 12.5 g/dL (ref 12.0–15.0)
Immature Granulocytes: 1 %
Lymphocytes Relative: 8 %
Lymphs Abs: 0.7 10*3/uL (ref 0.7–4.0)
MCH: 30.9 pg (ref 26.0–34.0)
MCHC: 32.6 g/dL (ref 30.0–36.0)
MCV: 95 fL (ref 80.0–100.0)
Monocytes Absolute: 0.7 10*3/uL (ref 0.1–1.0)
Monocytes Relative: 8 %
Neutro Abs: 7.2 10*3/uL (ref 1.7–7.7)
Neutrophils Relative %: 83 %
Platelets: 215 10*3/uL (ref 150–400)
RBC: 4.04 MIL/uL (ref 3.87–5.11)
RDW: 14.2 % (ref 11.5–15.5)
WBC: 8.7 10*3/uL (ref 4.0–10.5)
nRBC: 0 % (ref 0.0–0.2)

## 2019-10-05 LAB — TSH: TSH: 1.705 u[IU]/mL (ref 0.350–4.500)

## 2019-10-05 LAB — LACTIC ACID, PLASMA: Lactic Acid, Venous: 0.8 mmol/L (ref 0.5–1.9)

## 2019-10-05 LAB — CBG MONITORING, ED
Glucose-Capillary: 113 mg/dL — ABNORMAL HIGH (ref 70–99)
Glucose-Capillary: 254 mg/dL — ABNORMAL HIGH (ref 70–99)
Glucose-Capillary: 55 mg/dL — ABNORMAL LOW (ref 70–99)
Glucose-Capillary: 64 mg/dL — ABNORMAL LOW (ref 70–99)
Glucose-Capillary: <10 mg/dL — CL (ref 70–99)

## 2019-10-05 LAB — ETHANOL: Alcohol, Ethyl (B): <10 mg/dL (ref <10)

## 2019-10-05 LAB — CK TOTAL AND CKMB (NOT AT ARMC)
CK, MB: 61.8 ng/mL — ABNORMAL HIGH (ref 0.5–5.0)
Relative Index: 10.1 — ABNORMAL HIGH (ref 0.0–2.5)
Total CK: 613 U/L — ABNORMAL HIGH (ref 38–234)

## 2019-10-05 LAB — T4, FREE: Free T4: 1.52 ng/dL — ABNORMAL HIGH (ref 0.61–1.12)

## 2019-10-05 LAB — BETA-HYDROXYBUTYRIC ACID: Beta-Hydroxybutyric Acid: >8 mmol/L — ABNORMAL HIGH (ref 0.05–0.27)

## 2019-10-05 LAB — SALICYLATE LEVEL: Salicylate Lvl: <7 mg/dL (ref 2.8–30.0)

## 2019-10-05 LAB — HIV ANTIBODY (ROUTINE TESTING W REFLEX): HIV Screen 4th Generation wRfx: NONREACTIVE

## 2019-10-05 MED ORDER — THIAMINE HCL 100 MG/ML IJ SOLN
100.0000 mg | Freq: Every day | INTRAMUSCULAR | Status: DC
  Filled 2019-10-05 (×2): qty 2

## 2019-10-05 MED ORDER — LACTATED RINGERS IV BOLUS
1000.0000 mL | Freq: Once | INTRAVENOUS | Status: AC
  Administered 2019-10-05: 22:00:00 1000 mL via INTRAVENOUS

## 2019-10-05 MED ORDER — INSULIN ASPART 100 UNIT/ML ~~LOC~~ SOLN
0.0000 [IU] | Freq: Three times a day (TID) | SUBCUTANEOUS | Status: DC
  Administered 2019-10-06 – 2019-10-07 (×2): 2 [IU] via SUBCUTANEOUS
  Administered 2019-10-07: 3 [IU] via SUBCUTANEOUS
  Administered 2019-10-08 (×3): 2 [IU] via SUBCUTANEOUS
  Administered 2019-10-09: 12:00:00 9 [IU] via SUBCUTANEOUS
  Administered 2019-10-09 (×2): 1 [IU] via SUBCUTANEOUS
  Administered 2019-10-10: 2 [IU] via SUBCUTANEOUS
  Administered 2019-10-10: 5 [IU] via SUBCUTANEOUS
  Administered 2019-10-10 – 2019-10-11 (×2): 3 [IU] via SUBCUTANEOUS
  Administered 2019-10-11: 06:00:00 2 [IU] via SUBCUTANEOUS

## 2019-10-05 MED ORDER — LORAZEPAM 1 MG PO TABS: 1.0000 mg | ORAL_TABLET | ORAL | Status: DC | PRN

## 2019-10-05 MED ORDER — LORAZEPAM 2 MG/ML IJ SOLN: 1.0000 mg | INTRAMUSCULAR | Status: DC | PRN

## 2019-10-05 MED ORDER — INSULIN ASPART 100 UNIT/ML ~~LOC~~ SOLN
0.0000 [IU] | Freq: Every day | SUBCUTANEOUS | Status: DC
  Administered 2019-10-10: 2 [IU] via SUBCUTANEOUS

## 2019-10-05 MED ORDER — LORAZEPAM 2 MG/ML IJ SOLN: 0.0000 mg | Freq: Four times a day (QID) | INTRAMUSCULAR | Status: AC

## 2019-10-05 MED ORDER — VITAMIN B-1 100 MG PO TABS
100.0000 mg | ORAL_TABLET | Freq: Every day | ORAL | Status: DC
  Administered 2019-10-06 – 2019-10-11 (×6): 100 mg via ORAL
  Filled 2019-10-05 (×6): qty 1

## 2019-10-05 MED ORDER — ADULT MULTIVITAMIN W/MINERALS CH
1.0000 | ORAL_TABLET | Freq: Every day | ORAL | Status: DC
  Administered 2019-10-08: 09:00:00 1 via ORAL
  Filled 2019-10-05 (×6): qty 1

## 2019-10-05 MED ORDER — SODIUM CHLORIDE 0.9 % IV SOLN
INTRAVENOUS | Status: DC
  Administered 2019-10-06: via INTRAVENOUS

## 2019-10-05 MED ORDER — LORAZEPAM 2 MG/ML IJ SOLN: 0.0000 mg | Freq: Two times a day (BID) | INTRAMUSCULAR | Status: AC

## 2019-10-05 MED ORDER — SODIUM CHLORIDE 0.9 % IV BOLUS
1000.0000 mL | Freq: Once | INTRAVENOUS | Status: AC
  Administered 2019-10-05: 17:00:00 1000 mL via INTRAVENOUS

## 2019-10-05 MED ORDER — FOLIC ACID 1 MG PO TABS
1.0000 mg | ORAL_TABLET | Freq: Every day | ORAL | Status: DC
  Administered 2019-10-07 – 2019-10-11 (×5): 1 mg via ORAL
  Filled 2019-10-05 (×6): qty 1

## 2019-10-05 MED ORDER — ENOXAPARIN SODIUM 40 MG/0.4ML ~~LOC~~ SOLN
40.0000 mg | SUBCUTANEOUS | Status: DC
  Administered 2019-10-05 – 2019-10-10 (×6): 40 mg via SUBCUTANEOUS
  Filled 2019-10-05 (×6): qty 0.4

## 2019-10-05 MED ORDER — DEXTROSE 50 % IV SOLN
1.0000 | Freq: Once | INTRAVENOUS | Status: AC
  Administered 2019-10-05: 50 mL via INTRAVENOUS

## 2019-10-05 NOTE — ED Notes (Signed)
Pt eating food and having OJ. Will recheck cbg soon.

## 2019-10-05 NOTE — ED Notes (Addendum)
Pt cbg 76 with EMS. Pt drank one juice. Pt cbg 113, never less in the ED.

## 2019-10-05 NOTE — ED Provider Notes (Signed)
[facility] EMERGENCY DEPARTMENT Provider Note   CSN: [id number] Arrival date & time: 10/05/19  1510     History   Chief Complaint Chief Complaint  Patient presents with   Weakness    HPI Olivia Sweeney is a 53 y.o. female.     Pt presents to the ED today with weakness.  Pt said she's gotten so weak that she's unable to walk around her house.  She said she just crawls.  The weakness has lasted about 4-5 days.  She has been urinating on herself.  Pt's friend said pt has not been eating food.  Pt said she has no appetite.  She denies f/c.  She denies known covid exposures.  She has no n/v/d or pain.         Past Medical History:  Diagnosis Date   Anxiety    Very upset due to loss of ex- husband in Feb 2018   Diabetes mellitus without complication ([place])    diet controlled   ETOH abuse    Gallstones    Lynch syndrome 2014   Uterine cancer ([place]) 2014    Patient Active Problem List   Diagnosis Date Noted   Alcoholic ketoacidosis [id number]   UTI (urinary tract infection) 10/06/2019   Hypokalemia 10/06/2019   Gallstones 10/06/2019   Generalized weakness 10/06/2019   ETOH abuse    ARF (acute renal failure) ([place]) 10/05/2019   Abnormal liver function 10/05/2019   Genetic testing 12/16/2014   Lynch syndrome 09/03/2013   Endometrial ca ([place]) 03/17/2013   Shoulder pain 01/01/2012   Diabetes ([place]) 11/13/2011    Past Surgical History:  Procedure Laterality Date   [place]   looking for fibroids   ANTERIOR CERVICAL DECOMP/DISCECTOMY FUSION  03/06/2012   Procedure: ANTERIOR CERVICAL DECOMPRESSION/DISCECTOMY FUSION 1 LEVEL;  Surgeon: Sinclair Ship, MD;  Location: [facility];  Service: Orthopedics;  Laterality: Left;  Anterior cervical decompression fusion, cervical 5-6, cervical 6-7 with instrumentation and allograft.   cataracts     dental implant     also had soft tissue graft after dental implant     HYSTEROSCOPY WITH NOVASURE N/A 02/09/2013   Procedure: HYSTEROSCOPY WITH NOVASURE;  Surgeon: Olga Millers, MD;  Location: [facility];  Service: Gynecology;  Laterality: N/A;   LYMPH NODE DISSECTION Bilateral 03/17/2013   Procedure: LYMPH NODE DISSECTION;  Surgeon: Imagene Gurney A. Alycia Rossetti, MD;  Location: [facility];  Service: Gynecology;  Laterality: Bilateral;   ROBOTIC ASSISTED TOTAL HYSTERECTOMY WITH BILATERAL SALPINGO OOPHERECTOMY N/A 03/17/2013   Procedure: ROBOTIC ASSISTED TOTAL HYSTERECTOMY WITH BILATERAL SALPINGO OOPHORECTOMY;  Surgeon: Imagene Gurney A. Alycia Rossetti, MD;  Location: [facility];  Service: Gynecology;  Laterality: N/A;     OB History   No obstetric history on file.      Home Medications    Prior to Admission medications   Not on File    Family History Family History  Problem Relation Age of Onset   Breast cancer Mother        dx in her late 57s-early 78s   Uterine cancer Mother        dx in her 57s   Kidney cancer Mother        dx in her 80s   Ovarian cancer Maternal Aunt 64   Uterine cancer Maternal Grandmother        dx in her 64s   Uterine cancer Other     Social History Social History   Tobacco Use  Smoking status: Never Smoker   Smokeless tobacco: Never Used  Substance Use Topics   Alcohol use: Yes    Comment: 14-21 drinks in a week.   Drug use: No     Allergies   Bee venom and Other   Review of Systems Review of Systems  Neurological: Positive for weakness.  All other systems reviewed and are negative.    Physical Exam Updated Vital Signs BP 119/74 (BP Location: Right Arm)    Pulse 95    Temp 99.1 F (37.3 C) (Oral)    Resp 19    Ht 5\' 4"  (1.626 m)    Wt 49.1 kg    LMP 02/16/2013    SpO2 99%    BMI 18.58 kg/m   Physical Exam Vitals signs and nursing note reviewed.  Constitutional:      Appearance: Normal appearance.  HENT:     Head: Normocephalic and atraumatic.     Right Ear: External ear normal.     Left Ear: External ear normal.      Nose: Nose normal.     Mouth/Throat:     Mouth: Mucous membranes are moist.     Pharynx: Oropharynx is clear.  Eyes:     Extraocular Movements: Extraocular movements intact.     Conjunctiva/sclera: Conjunctivae normal.     Pupils: Pupils are equal, round, and reactive to light.  Neck:     Musculoskeletal: Normal range of motion and neck supple.  Cardiovascular:     Rate and Rhythm: Normal rate and regular rhythm.     Pulses: Normal pulses.     Heart sounds: Normal heart sounds.  Pulmonary:     Effort: Pulmonary effort is normal.     Breath sounds: Normal breath sounds.  Abdominal:     General: Abdomen is flat. Bowel sounds are normal.     Palpations: Abdomen is soft.  Musculoskeletal: Normal range of motion.  Skin:    General: Skin is warm.     Capillary Refill: Capillary refill takes less than 2 seconds.  Neurological:     General: No focal deficit present.     Mental Status: She is alert and oriented to person, place, and time.  Psychiatric:        Mood and Affect: Mood normal.        Behavior: Behavior normal.        Thought Content: Thought content normal.        Judgment: Judgment normal.      ED Treatments / Results  Labs (all labs ordered are listed, but only abnormal results are displayed) Labs Reviewed  COMPREHENSIVE METABOLIC PANEL - Abnormal; Notable for the following components:      Result Value   Sodium 133 (*)    Chloride 96 (*)    CO2 12 (*)    Glucose, Bld 122 (*)    BUN 25 (*)    Creatinine, Ser 1.76 (*)    AST 117 (*)    Total Bilirubin 2.2 (*)    GFR calc non Af Amer 32 (*)    GFR calc Af Amer 37 (*)    Anion gap 25 (*)    All other components within normal limits  URINALYSIS, ROUTINE W REFLEX MICROSCOPIC - Abnormal; Notable for the following components:   APPearance HAZY (*)    Glucose, UA 50 (*)    Hgb urine dipstick SMALL (*)    Ketones, ur 80 (*)    Nitrite POSITIVE (*)  Bacteria, UA MANY (*)    All other components within  normal limits  BETA-HYDROXYBUTYRIC ACID - Abnormal; Notable for the following components:   Beta-Hydroxybutyric Acid >8.00 (*)    All other components within normal limits  T4, FREE - Abnormal; Notable for the following components:   Free T4 1.52 (*)    All other components within normal limits  COMPREHENSIVE METABOLIC PANEL - Abnormal; Notable for the following components:   Potassium 3.0 (*)    CO2 20 (*)    Creatinine, Ser 1.28 (*)    Calcium 8.3 (*)    Total Protein 5.3 (*)    Albumin 2.8 (*)    AST 63 (*)    Total Bilirubin 1.9 (*)    GFR calc non Af Amer 47 (*)    GFR calc Af Amer 54 (*)    Anion gap 16 (*)    All other components within normal limits  CBC - Abnormal; Notable for the following components:   RBC 3.32 (*)    Hemoglobin 10.3 (*)    HCT 30.8 (*)    All other components within normal limits  CK TOTAL AND CKMB (NOT AT [facility]) - Abnormal; Notable for the following components:   Total CK 613 (*)    CK, MB 61.8 (*)    Relative Index 10.1 (*)    All other components within normal limits  ACETAMINOPHEN LEVEL - Abnormal; Notable for the following components:   Acetaminophen (Tylenol), Serum <10 (*)    All other components within normal limits  CK TOTAL AND CKMB (NOT AT [facility]) - Abnormal; Notable for the following components:   Total CK 253 (*)    CK, MB 24.4 (*)    Relative Index 9.6 (*)    All other components within normal limits  MAGNESIUM - Abnormal; Notable for the following components:   Magnesium 1.2 (*)    All other components within normal limits  PREALBUMIN - Abnormal; Notable for the following components:   Prealbumin 10.9 (*)    All other components within normal limits  GLUCOSE, CAPILLARY - Abnormal; Notable for the following components:   Glucose-Capillary 125 (*)    All other components within normal limits  GLUCOSE, CAPILLARY - Abnormal; Notable for the following components:   Glucose-Capillary <10 (*)    All other components within normal  limits  CBG MONITORING, ED - Abnormal; Notable for the following components:   Glucose-Capillary <10 (*)    All other components within normal limits  CBG MONITORING, ED - Abnormal; Notable for the following components:   Glucose-Capillary 113 (*)    All other components within normal limits  POCT I-STAT EG7 - Abnormal; Notable for the following components:   pCO2, Ven 25.8 (*)    pO2, Ven 74.0 (*)    Bicarbonate 11.9 (*)    TCO2 13 (*)    Acid-base deficit 13.0 (*)    Potassium 3.1 (*)    HCT 51.0 (*)    Hemoglobin 17.3 (*)    All other components within normal limits  CBG MONITORING, ED - Abnormal; Notable for the following components:   Glucose-Capillary 55 (*)    All other components within normal limits  CBG MONITORING, ED - Abnormal; Notable for the following components:   Glucose-Capillary 64 (*)    All other components within normal limits  CBG MONITORING, ED - Abnormal; Notable for the following components:   Glucose-Capillary 254 (*)    All other components within normal limits  CULTURE, BLOOD (ROUTINE X 2)  CULTURE, BLOOD (ROUTINE X 2)  SARS CORONAVIRUS 2 (TAT 6-24 HRS)  URINE CULTURE  CBC WITH DIFFERENTIAL/PLATELET  RAPID URINE DRUG SCREEN, HOSP PERFORMED  ETHANOL  LACTIC ACID, PLASMA  TSH  SALICYLATE LEVEL  HIV ANTIBODY (ROUTINE TESTING W REFLEX)  SEDIMENTATION RATE  VITAMIN B12  HEMOGLOBIN A1C  HEPATITIS PANEL, ACUTE  I-STAT VENOUS BLOOD GAS, ED  CBG MONITORING, ED    EKG EKG Interpretation  Date/Time:  Monday October 05 2019 16:37:30 EST Ventricular Rate:  86 PR Interval:    QRS Duration: 94 QT Interval:  406 QTC Calculation: 486 R Axis:   50 Text Interpretation: Accelerated junctional rhythm Nonspecific T abnormalities, lateral leads Borderline prolonged QT interval Artifact in lead(s) II III aVR aVL aVF V1 V2 V3 V4 V5 V6 Poor data quality in current ECG precludes serial comparison Confirmed by Addison Lank [id number]) on 10/06/2019 12:14:04  PM   Radiology Dg Chest 2 View  Result Date: 10/05/2019 CLINICAL DATA:  Weakness EXAM: CHEST - 2 VIEW COMPARISON:  Chest CT 07/02/2017, chest radiograph 03/06/2013 FINDINGS: No consolidation, features of edema, pneumothorax, or effusion. Pulmonary vascularity is normally distributed. The cardiomediastinal contours are unremarkable. No acute osseous or soft tissue abnormality. Partially visualized lower cervical ACDF. IMPRESSION: No acute cardiopulmonary abnormality. Electronically Signed   By: Lovena Le M.D.   On: 10/05/2019 16:16   Ct Head Wo Contrast  Result Date: 10/05/2019 CLINICAL DATA:  Weakness and difficulty walking EXAM: CT HEAD WITHOUT CONTRAST TECHNIQUE: Contiguous axial images were obtained from the base of the skull through the vertex without intravenous contrast. COMPARISON:  None. FINDINGS: Brain: There is no mass, hemorrhage or extra-axial collection. The size and configuration of the ventricles and extra-axial CSF spaces are normal. There is hypoattenuation of the white matter, most commonly indicating chronic small vessel disease. There is an old right basal ganglia small vessel infarct. Vascular: No abnormal hyperdensity of the major intracranial arteries or dural venous sinuses. No intracranial atherosclerosis. Skull: The visualized skull base, calvarium and extracranial soft tissues are normal. Sinuses/Orbits: No fluid levels or advanced mucosal thickening of the visualized paranasal sinuses. No mastoid or middle ear effusion. The orbits are normal. IMPRESSION: No acute intracranial abnormality. Electronically Signed   By: Ulyses Jarred M.D.   On: 10/05/2019 21:39   US Renal  Result Date: 10/06/2019 CLINICAL DATA:  Acute kidney injury. EXAM: RENAL / URINARY TRACT ULTRASOUND COMPLETE COMPARISON:  None. FINDINGS: Right Kidney: Renal measurements: 8.6 x 4.6 x 5.1 cm = volume: 103 mL. Echogenicity within normal limits. No mass or hydronephrosis visualized. Left Kidney: Renal  measurements: 8.3 x 4.6 x 5.0 cm = volume: 98.3 mL. Echogenicity within normal limits. No mass or hydronephrosis visualized. Bladder: Distended without wall thickening or focal abnormality. Neither ureteral jet is visualized. Other: None. IMPRESSION: 1. Normal sonographic appearance of the kidneys. 2. Distended urinary bladder without wall thickening. Electronically Signed   By: Keith Rake M.D.   On: 10/06/2019 03:57   US Abdomen Limited Ruq  Result Date: 10/05/2019 CLINICAL DATA:  Abnormal liver function. Additional history provided: History of diabetes mellitus. EXAM: ULTRASOUND ABDOMEN LIMITED RIGHT UPPER QUADRANT COMPARISON:  No pertinent prior studies available for comparison. FINDINGS: The patient was unable to reposition during the examination, somewhat limiting evaluation. Gallbladder: No gallbladder wall thickening. Sludge and stones within the gallbladder lumen. No sonographic Percell Miller sign was elicited by the scanning technologist. Common bile duct: Diameter: Visualized proximal common duct measures 3-4 mm, within normal  limits. Liver: No focal lesion identified. Within normal limits in parenchymal echogenicity. Interrogated portal vein is patent on color Doppler imaging with normal direction of blood flow towards the liver. IMPRESSION: Stones and sludge within the gallbladder without gallbladder wall thickening or sonographic Murphy sign. Otherwise unremarkable right upper quadrant ultrasound, as detailed. Electronically Signed   By: Kellie Simmering DO   On: 10/05/2019 21:32    Procedures Procedures (including critical care time)  Medications Ordered in ED Medications  enoxaparin (LOVENOX) injection 40 mg (40 mg Subcutaneous Given 10/05/19 2251)  insulin aspart (novoLOG) injection 0-9 Units (0 Units Subcutaneous Not Given 10/06/19 1123)  insulin aspart (novoLOG) injection 0-5 Units (0 Units Subcutaneous Not Given 10/05/19 2206)  LORazepam (ATIVAN) tablet 1-4 mg (has no administration in time  range)    Or  LORazepam (ATIVAN) injection 1-4 mg (has no administration in time range)  thiamine (VITAMIN B-1) tablet 100 mg (100 mg Oral Given 10/06/19 1012)    Or  thiamine (B-1) injection 100 mg ( Intravenous See Alternative [id number])  folic acid (FOLVITE) tablet 1 mg (1 mg Oral Not Given 10/06/19 1011)  multivitamin with minerals tablet 1 tablet (1 tablet Oral Not Given 10/06/19 1011)  LORazepam (ATIVAN) injection 0-4 mg (0 mg Intravenous Not Given 10/06/19 1209)    Followed by  LORazepam (ATIVAN) injection 0-4 mg (has no administration in time range)  cefTRIAXone (ROCEPHIN) 1 g in sodium chloride 0.9 % 100 mL IVPB (0 g Intravenous Stopped 10/06/19 0831)  0.9 %  sodium chloride infusion ( Intravenous New Bag/Given 10/06/19 1012)  feeding supplement (BOOST / RESOURCE BREEZE) liquid 1 Container (1 Container Oral Given 10/06/19 1556)  sodium chloride 0.9 % bolus 1,000 mL (0 mLs Intravenous Stopped 10/05/19 2153)  lactated ringers bolus 1,000 mL (0 mLs Intravenous Stopped 10/06/19 0019)  dextrose 50 % solution 50 mL (50 mLs Intravenous Given 10/05/19 2247)  thiamine (B-1) injection 100 mg (100 mg Intravenous Given 10/06/19 0016)  potassium chloride SA (KLOR-CON) CR tablet 40 mEq (40 mEq Oral Given 10/06/19 1211)  magnesium sulfate IVPB 2 g 50 mL (2 g Intravenous New Bag/Given 10/06/19 1304)     Initial Impression / Assessment and Plan / ED Course  I have reviewed the triage vital signs and the nursing notes.  Pertinent labs & imaging results that were available during my care of the patient were reviewed by me and considered in my medical decision making (see chart for details).    Labs were pending at shift change.  Pt signed out to Dr. Regenia Skeeter.  Final Clinical Impressions(s) / ED Diagnoses   Final diagnoses:  Weakness    ED Discharge Orders    None       Isla Pence, MD 10/06/19 [phone]

## 2019-10-05 NOTE — H&P (Signed)
TRH H&P    Patient Demographics:    Olivia Sweeney, is a 53 y.o. female  MRN: [id number]  DOB - 31-Jan-1966  Admit Date - 10/05/2019  Referring MD/NP/PA: Sherwood Gambler  Outpatient Primary MD for the patient is Patient, No Pcp Per [facility] - oncology  Patient coming from: home  Chief complaint- generalized weakness   HPI:    Olivia Sweeney  is a 53 y.o. female, w dm2, Lynch syndrome w uterine cancer presents with generalized weakness,  Not feeling well. Pt denies fever, chills, cough, cp, palp, sob, abd pain, n/v, diarrhea, brbpr, black stool, dysuria, hematuria,  Pt admits to drinking etoh.  Pt is not really forthcoming about how much that she drinks daily.  Last drink was this past Wednesday.   In ED T 97.8 P 88 R 16, Bp 134/82  Pox 100% on RA Wt 61.2kg  CXR IMPRESSION: No acute cardiopulmonary abnormality.   Na 133, K 4.0, Bun 25 , Creatinine 1.76 Ast 117, Alt 43, Alk phos 47, T. Bili 2.2  Wbc 8.7, Hgb 12.5, Plt 950 ETOH <93 Salicylate <7 Ph 2.67  Pt will be admitted for mild ARF, dehydration, and abnormal liver function.    Review of systems:    In addition to the HPI above,  No Fever-chills, No Headache, No changes with Vision or hearing, No problems swallowing food or Liquids, No Chest pain, Cough or Shortness of Breath, No Abdominal pain, No Nausea or Vomiting, bowel movements are regular, No Blood in stool or Urine, No dysuria, No new skin rashes or bruises, No new joints pains-aches,  No new  , tingling, numbness in any extremity, No recent weight gain or loss, No polyuria, polydypsia or polyphagia, No significant Mental Stressors.  All other systems reviewed and are negative.    Past History of the following :    Past Medical History:  Diagnosis Date  . Anxiety    Very upset due to loss of ex- husband in Feb 2018  . Diabetes mellitus without complication (HCC)     diet controlled  . Lynch syndrome 2014  . Uterine cancer ([place]) 2014      Past Surgical History:  Procedure Laterality Date  . [place]   looking for fibroids  . ANTERIOR CERVICAL DECOMP/DISCECTOMY FUSION  03/06/2012   Procedure: ANTERIOR CERVICAL DECOMPRESSION/DISCECTOMY FUSION 1 LEVEL;  Surgeon: Sinclair Ship, MD;  Location: [facility];  Service: Orthopedics;  Laterality: Left;  Anterior cervical decompression fusion, cervical 5-6, cervical 6-7 with instrumentation and allograft.  . cataracts    . dental implant     also had soft tissue graft after dental implant   . HYSTEROSCOPY WITH NOVASURE N/A 02/09/2013   Procedure: HYSTEROSCOPY WITH NOVASURE;  Surgeon: Olga Millers, MD;  Location: [facility];  Service: Gynecology;  Laterality: N/A;  . LYMPH NODE DISSECTION Bilateral 03/17/2013   Procedure: LYMPH NODE DISSECTION;  Surgeon: Imagene Gurney A. Alycia Rossetti, MD;  Location: [facility];  Service: Gynecology;  Laterality: Bilateral;  . ROBOTIC ASSISTED TOTAL HYSTERECTOMY WITH BILATERAL  SALPINGO OOPHERECTOMY N/A 03/17/2013   Procedure: ROBOTIC ASSISTED TOTAL HYSTERECTOMY WITH BILATERAL SALPINGO OOPHORECTOMY;  Surgeon: Imagene Gurney A. Alycia Rossetti, MD;  Location: [facility];  Service: Gynecology;  Laterality: N/A;      Social History:      Social History   Tobacco Use  . Smoking status: Never Smoker  . Smokeless tobacco: Never Used  Substance Use Topics  . Alcohol use: Yes    Comment: 14-21 drinks in a week.       Family History :     Family History  Problem Relation Age of Onset  . Breast cancer Mother        dx in her late 40s-early 66s  . Uterine cancer Mother        dx in her 37s  . Kidney cancer Mother        dx in her 71s  . Ovarian cancer Maternal Aunt 48  . Uterine cancer Maternal Grandmother        dx in her 47s  . Uterine cancer Other        Home Medications:   Prior to Admission medications   Not on File     Allergies:     Allergies  Allergen Reactions   . Bee Venom Shortness Of Breath and Swelling  . Other Other (See Comments)    Cats; Reaction: sneezing, etc     Physical Exam:   Vitals  Blood pressure 134/82, pulse 88, temperature 97.8 F (36.6 C), temperature source Oral, resp. rate 16, height 5' 4"  (1.626 m), weight 61.2 kg, last menstrual period 02/16/2013, SpO2 100 %.  1.  General: axoxo3  2. Psychiatric: euthymic  3. Neurologic: Cn 2-12 intact, reflexes 2+ symmetric, diffuse with no clonus, motor 5/5 in all 4 ext  4. HEENMT:  Anicteric, pupils 1.65m symmetric, direct, consensual, near intact Mucous membranes dry Neck: no jvd  5. Respiratory : CTAB  6. Cardiovascular : rrr s1, s2, no m/g/r  7. Gastrointestinal:  Abd: soft, nt, nd, +bs  8. Skin:  Ext: no c/c/e,   9.Musculoskeletal:  Good ROM    Data Review:    CBC Recent Labs  Lab 10/05/19 1525  WBC 8.7  HGB 12.5  HCT 38.4  PLT 215  MCV 95.0  MCH 30.9  MCHC 32.6  RDW 14.2  LYMPHSABS 0.7  MONOABS 0.7  EOSABS 0.0  BASOSABS 0.0   ------------------------------------------------------------------------------------------------------------------  Results for orders placed or performed during the hospital encounter of 10/05/19 (from the past 48 hour(s))  Comprehensive metabolic panel     Status: Abnormal   Collection Time: 10/05/19  3:25 PM  Result Value Ref Range   Sodium 133 (L) 135 - 145 mmol/L   Potassium 4.0 3.5 - 5.1 mmol/L   Chloride 96 (L) 98 - 111 mmol/L   CO2 12 (L) 22 - 32 mmol/L   Glucose, Bld 122 (H) 70 - 99 mg/dL   BUN 25 (H) 6 - 20 mg/dL   Creatinine, Ser 1.76 (H) 0.44 - 1.00 mg/dL   Calcium 9.3 8.9 - 10.3 mg/dL   Total Protein 7.1 6.5 - 8.1 g/dL   Albumin 3.9 3.5 - 5.0 g/dL   AST 117 (H) 15 - 41 U/L   ALT 43 0 - 44 U/L   Alkaline Phosphatase 47 38 - 126 U/L   Total Bilirubin 2.2 (H) 0.3 - 1.2 mg/dL   GFR calc non Af Amer 32 (L) >60 mL/min   GFR calc Af Amer 37 (L) >60 mL/min  Anion gap 25 (H) 5 - 15    Comment:  Performed at [facility], [place] [address] [place], [place] [address]  CBC with Differential     Status: None   Collection Time: 10/05/19  3:25 PM  Result Value Ref Range   WBC 8.7 4.0 - 10.5 K/uL   RBC 4.04 3.87 - 5.11 MIL/uL   Hemoglobin 12.5 12.0 - 15.0 g/dL   HCT 38.4 36.0 - 46.0 %   MCV 95.0 80.0 - 100.0 fL   MCH 30.9 26.0 - 34.0 pg   MCHC 32.6 30.0 - 36.0 g/dL   RDW 14.2 11.5 - 15.5 %   Platelets 215 150 - 400 K/uL   nRBC 0.0 0.0 - 0.2 %   Neutrophils Relative % 83 %   Neutro Abs 7.2 1.7 - 7.7 K/uL   Lymphocytes Relative 8 %   Lymphs Abs 0.7 0.7 - 4.0 K/uL   Monocytes Relative 8 %   Monocytes Absolute 0.7 0.1 - 1.0 K/uL   Eosinophils Relative 0 %   Eosinophils Absolute 0.0 0.0 - 0.5 K/uL   Basophils Relative 0 %   Basophils Absolute 0.0 0.0 - 0.1 K/uL   Immature Granulocytes 1 %   Abs Immature Granulocytes 0.04 0.00 - 0.07 K/uL    Comment: Performed at [facility], [address], [place], [place] [address]  Ethanol     Status: None   Collection Time: 10/05/19  4:45 PM  Result Value Ref Range   Alcohol, Ethyl (B) <10 <10 mg/dL    Comment: (NOTE) Lowest detectable limit for serum alcohol is 10 mg/dL. For medical purposes only. Performed at [facility], [place] [address], [place], [place] [address]   CBG monitoring, ED     Status: Abnormal   Collection Time: 10/05/19  4:56 PM  Result Value Ref Range   Glucose-Capillary <10 (LL) 70 - 99 mg/dL  CBG monitoring, ED     Status: Abnormal   Collection Time: 10/05/19  4:59 PM  Result Value Ref Range   Glucose-Capillary 113 (H) 70 - 99 mg/dL    Chemistries  Recent Labs  Lab 10/05/19 1525  NA 133*  K 4.0  CL 96*  CO2 12*  GLUCOSE 122*  BUN 25*  CREATININE 1.76*  CALCIUM 9.3  AST 117*  ALT 43  ALKPHOS 47  BILITOT 2.2*   ------------------------------------------------------------------------------------------------------------------   ------------------------------------------------------------------------------------------------------------------ GFR: Estimated Creatinine Clearance: 31.2 mL/min (A) (by C-G formula based on SCr of 1.76 mg/dL (H)). Liver Function Tests: Recent Labs  Lab 10/05/19 1525  AST 117*  ALT 43  ALKPHOS 47  BILITOT 2.2*  PROT 7.1  ALBUMIN 3.9   No results for input(s): LIPASE, AMYLASE in the last 168 hours. No results for input(s): AMMONIA in the last 168 hours. Coagulation Profile: No results for input(s): INR, PROTIME in the last 168 hours. Cardiac Enzymes: No results for input(s): CKTOTAL, CKMB, CKMBINDEX, TROPONINI in the last 168 hours. BNP (last 3 results) No results for input(s): PROBNP in the last 8760 hours. HbA1C: No results for input(s): HGBA1C in the last 72 hours. CBG: Recent Labs  Lab 10/05/19 1656 10/05/19 1659  GLUCAP <10* 113*   Lipid Profile: No results for input(s): CHOL, HDL, LDLCALC, TRIG, CHOLHDL, LDLDIRECT in the last 72 hours. Thyroid Function Tests: No results for input(s): TSH, T4TOTAL, FREET4, T3FREE, THYROIDAB in the last 72 hours. Anemia Panel: No results for input(s): VITAMINB12, FOLATE, FERRITIN, TIBC, IRON, RETICCTPCT in the last 72  hours.  --------------------------------------------------------------------------------------------------------------- Urine analysis:    Component Value Date/Time   COLORURINE Yellow 10/09/2013 [place] 10/09/2013 1554   LABSPEC >=1.030 10/09/2013 1554   PHURINE 6.0 10/09/2013 1554   GLUCOSEU NEGATIVE 10/09/2013 1554   HGBUR LARGE 10/09/2013 1554   BILIRUBINUR SMALL 10/09/2013 1554   KETONESUR 15 10/09/2013 1554   PROTEINUR 30 (A) 03/06/2012 0911   UROBILINOGEN 0.2 10/09/2013 1554   NITRITE POSITIVE 10/09/2013 1554   LEUKOCYTESUR NEGATIVE 10/09/2013 1554      Imaging Results:    Dg Chest 2 View  Result Date: 10/05/2019 CLINICAL DATA:  Weakness EXAM: CHEST - 2 VIEW COMPARISON:  Chest  CT 07/02/2017, chest radiograph 03/06/2013 FINDINGS: No consolidation, features of edema, pneumothorax, or effusion. Pulmonary vascularity is normally distributed. The cardiomediastinal contours are unremarkable. No acute osseous or soft tissue abnormality. Partially visualized lower cervical ACDF. IMPRESSION: No acute cardiopulmonary abnormality. Electronically Signed   By: Lovena Le M.D.   On: 10/05/2019 16:16    ekg nsr at 85, nl axis, nl int, no st-t changes c/w ischemia   Assessment & Plan:    Active Problems:   ARF (acute renal failure) (HCC)  ARF Hydrate with ns iv Check cmp in am  Abnormal liver function Check cpk r/o rhabdomyolysis Check acute hepatitis panel RUQ ultrasound Check cmp in am  AG acidosis secondary to Alcoholic Ketoacidosis ? ETOH abuse  Etoh use Check b12 CIWA  ? Dm2 Check hga1c,  fsbs ac and qhs, ISS  DVT Prophylaxis-   Lovenox - SCDs   AM Labs Ordered, also please review Full Orders  Family Communication: Admission, patients condition and plan of care including tests being ordered have been discussed with the patient  who indicate understanding and agree with the plan and Code Status.  Code Status:  FULL CODE per patient,   Admission status: Observation: Based on patients clinical presentation and evaluation of above clinical data, I have made determination that patient meets obserfvation criteria at this time.   Time spent in minutes : 55 minutes   Jani Gravel M.D on 10/05/2019 at 8:43 PM

## 2019-10-05 NOTE — ED Triage Notes (Signed)
[facility] reports pt weakness 4-5 days. Friend concerned because pt urinating on self and uneaten food on porch. [facility] reports pt A&O x 4. Friend reports ETOH. Hx of anorexia. 74 cbg.

## 2019-10-05 NOTE — ED Notes (Signed)
Pt given orange juice as a precaution since cbg 76 with ems. Patient transported to X-ray

## 2019-10-05 NOTE — ED Notes (Signed)
Patient transported to Ultrasound 

## 2019-10-05 NOTE — ED Provider Notes (Signed)
Care transferred to me.  Of note, the glucose listed at less than 10 was entered in error and was machine malfunction.  Her true glucose was 113.  Overall, no specific cause of the generalized weakness but there is an AKI.  Has an anion gap acidosis with a bicarb of 12 though she overall appears okay.  Will send other labs including lactic acid.  She does endorse chronic alcohol abuse though has not had alcohol in a couple days and also does not appear to be in acute withdrawal.  Will hydrate.  Discussed with Dr. Maudie Mercury for admission.   Sherwood Gambler, MD 10/05/19 2039

## 2019-10-05 NOTE — ED Notes (Signed)
Paged attending with cbg results. Awaiting orders.

## 2019-10-05 NOTE — ED Notes (Signed)
Pt aware of the need for urine, but cannot give any at this time.

## 2019-10-06 ENCOUNTER — Observation Stay (HOSPITAL_COMMUNITY): Payer: Self-pay

## 2019-10-06 ENCOUNTER — Other Ambulatory Visit: Payer: Self-pay

## 2019-10-06 ENCOUNTER — Encounter (HOSPITAL_COMMUNITY): Payer: Self-pay | Admitting: Internal Medicine

## 2019-10-06 DIAGNOSIS — K8020 Calculus of gallbladder without cholecystitis without obstruction: Secondary | ICD-10-CM | POA: Diagnosis present

## 2019-10-06 DIAGNOSIS — R531 Weakness: Secondary | ICD-10-CM

## 2019-10-06 DIAGNOSIS — E8729 Other acidosis: Secondary | ICD-10-CM | POA: Diagnosis present

## 2019-10-06 DIAGNOSIS — N390 Urinary tract infection, site not specified: Secondary | ICD-10-CM | POA: Diagnosis present

## 2019-10-06 DIAGNOSIS — F1010 Alcohol abuse, uncomplicated: Secondary | ICD-10-CM | POA: Diagnosis present

## 2019-10-06 DIAGNOSIS — E876 Hypokalemia: Secondary | ICD-10-CM | POA: Diagnosis present

## 2019-10-06 DIAGNOSIS — E872 Acidosis: Secondary | ICD-10-CM | POA: Diagnosis present

## 2019-10-06 LAB — RAPID URINE DRUG SCREEN, HOSP PERFORMED
Amphetamines: NOT DETECTED
Barbiturates: NOT DETECTED
Benzodiazepines: NOT DETECTED
Cocaine: NOT DETECTED
Opiates: NOT DETECTED
Tetrahydrocannabinol: NOT DETECTED

## 2019-10-06 LAB — COMPREHENSIVE METABOLIC PANEL
ALT: 30 U/L (ref 0–44)
AST: 63 U/L — ABNORMAL HIGH (ref 15–41)
Albumin: 2.8 g/dL — ABNORMAL LOW (ref 3.5–5.0)
Alkaline Phosphatase: 38 U/L (ref 38–126)
Anion gap: 16 — ABNORMAL HIGH (ref 5–15)
BUN: 18 mg/dL (ref 6–20)
CO2: 20 mmol/L — ABNORMAL LOW (ref 22–32)
Calcium: 8.3 mg/dL — ABNORMAL LOW (ref 8.9–10.3)
Chloride: 104 mmol/L (ref 98–111)
Creatinine, Ser: 1.28 mg/dL — ABNORMAL HIGH (ref 0.44–1.00)
GFR calc Af Amer: 54 mL/min — ABNORMAL LOW (ref >60)
GFR calc non Af Amer: 47 mL/min — ABNORMAL LOW (ref >60)
Glucose, Bld: 87 mg/dL (ref 70–99)
Potassium: 3 mmol/L — ABNORMAL LOW (ref 3.5–5.1)
Sodium: 140 mmol/L (ref 135–145)
Total Bilirubin: 1.9 mg/dL — ABNORMAL HIGH (ref 0.3–1.2)
Total Protein: 5.3 g/dL — ABNORMAL LOW (ref 6.5–8.1)

## 2019-10-06 LAB — HEPATITIS PANEL, ACUTE
HCV Ab: NONREACTIVE
Hep A IgM: NONREACTIVE
Hep B C IgM: NONREACTIVE
Hepatitis B Surface Ag: NONREACTIVE

## 2019-10-06 LAB — ACETAMINOPHEN LEVEL: Acetaminophen (Tylenol), Serum: <10 ug/mL — ABNORMAL LOW (ref 10–30)

## 2019-10-06 LAB — URINALYSIS, ROUTINE W REFLEX MICROSCOPIC
Bilirubin Urine: NEGATIVE
Glucose, UA: 50 mg/dL — AB
Ketones, ur: 80 mg/dL — AB
Leukocytes,Ua: NEGATIVE
Nitrite: POSITIVE — AB
Protein, ur: NEGATIVE mg/dL
Specific Gravity, Urine: 1.01 (ref 1.005–1.030)
pH: 6 (ref 5.0–8.0)

## 2019-10-06 LAB — GLUCOSE, CAPILLARY
Glucose-Capillary: <10 mg/dL — CL (ref 70–99)
Glucose-Capillary: 125 mg/dL — ABNORMAL HIGH (ref 70–99)
Glucose-Capillary: 189 mg/dL — ABNORMAL HIGH (ref 70–99)
Glucose-Capillary: 162 mg/dL — ABNORMAL HIGH (ref 70–99)

## 2019-10-06 LAB — HEMOGLOBIN A1C
Hgb A1c MFr Bld: 5.2 % (ref 4.8–5.6)
Mean Plasma Glucose: 102.54 mg/dL

## 2019-10-06 LAB — CBC
HCT: 30.8 % — ABNORMAL LOW (ref 36.0–46.0)
Hemoglobin: 10.3 g/dL — ABNORMAL LOW (ref 12.0–15.0)
MCH: 31 pg (ref 26.0–34.0)
MCHC: 33.4 g/dL (ref 30.0–36.0)
MCV: 92.8 fL (ref 80.0–100.0)
Platelets: 172 10*3/uL (ref 150–400)
RBC: 3.32 MIL/uL — ABNORMAL LOW (ref 3.87–5.11)
RDW: 14 % (ref 11.5–15.5)
WBC: 5.9 10*3/uL (ref 4.0–10.5)
nRBC: 0 % (ref 0.0–0.2)

## 2019-10-06 LAB — CK TOTAL AND CKMB (NOT AT ARMC)
CK, MB: 24.4 ng/mL — ABNORMAL HIGH (ref 0.5–5.0)
Relative Index: 9.6 — ABNORMAL HIGH (ref 0.0–2.5)
Total CK: 253 U/L — ABNORMAL HIGH (ref 38–234)

## 2019-10-06 LAB — CBG MONITORING, ED: Glucose-Capillary: 77 mg/dL (ref 70–99)

## 2019-10-06 LAB — VITAMIN B12: Vitamin B-12: 637 pg/mL (ref 180–914)

## 2019-10-06 LAB — PREALBUMIN: Prealbumin: 10.9 mg/dL — ABNORMAL LOW (ref 18–38)

## 2019-10-06 LAB — SEDIMENTATION RATE: Sed Rate: 6 mm/hr (ref 0–22)

## 2019-10-06 LAB — SARS CORONAVIRUS 2 (TAT 6-24 HRS): SARS Coronavirus 2: NEGATIVE

## 2019-10-06 LAB — MAGNESIUM: Magnesium: 1.2 mg/dL — ABNORMAL LOW (ref 1.7–2.4)

## 2019-10-06 MED ORDER — MAGNESIUM SULFATE 2 GM/50ML IV SOLN
2.0000 g | Freq: Once | INTRAVENOUS | Status: AC
  Administered 2019-10-06: 13:00:00 2 g via INTRAVENOUS
  Filled 2019-10-06: qty 50

## 2019-10-06 MED ORDER — THIAMINE HCL 100 MG/ML IJ SOLN
100.0000 mg | Freq: Once | INTRAMUSCULAR | Status: AC
  Administered 2019-10-06: 100 mg via INTRAVENOUS

## 2019-10-06 MED ORDER — SODIUM CHLORIDE 0.9 % IV SOLN
1.0000 g | Freq: Every day | INTRAVENOUS | Status: DC
  Administered 2019-10-06 – 2019-10-07 (×2): 1 g via INTRAVENOUS
  Filled 2019-10-06 (×2): qty 10

## 2019-10-06 MED ORDER — BOOST / RESOURCE BREEZE PO LIQD CUSTOM
1.0000 | Freq: Three times a day (TID) | ORAL | Status: DC
  Administered 2019-10-06 – 2019-10-09 (×6): 1 via ORAL
  Filled 2019-10-06 (×3): qty 1

## 2019-10-06 MED ORDER — SODIUM CHLORIDE 0.9 % IV SOLN
INTRAVENOUS | Status: AC
  Administered 2019-10-06: 10:00:00 via INTRAVENOUS

## 2019-10-06 MED ORDER — POTASSIUM CHLORIDE CRYS ER 20 MEQ PO TBCR
40.0000 meq | EXTENDED_RELEASE_TABLET | ORAL | Status: AC
  Administered 2019-10-06 (×2): 40 meq via ORAL
  Filled 2019-10-06 (×2): qty 2

## 2019-10-06 NOTE — Evaluation (Signed)
Physical Therapy Evaluation Patient Details Name: Olivia Sweeney MRN: [id number] DOB: [birth year] Today's Date: 10/06/2019   History of Present Illness  53 year old who was admitted due to weakness and not feeling well. In further notes it is found that a friend had her come due to that she was not eating and was urinating on herself. PMH to include: ETOH use, uterine Ca, Lynch syndrome.  Clinical Impression  Patient received in bed, finishing lunch. Agrees to PT assessment. Requires increased time to perform tasks, min assist with supine to sit. Min/mod assist for sit to stand from low bed. She ambulates 125 feet with RW and min guard assist. Fatigued with this distance and activity. She will continue to benefit from skilled PT while here to improve strength and safety with mobility.     Follow Up Recommendations Home health PT    Equipment Recommendations  Rolling walker with 5" wheels    Recommendations for Other Services       Precautions / Restrictions Precautions Precautions: None Restrictions Weight Bearing Restrictions: No      Mobility  Bed Mobility Overal bed mobility: Needs Assistance Bed Mobility: Supine to Sit;Sit to Supine     Supine to sit: Min guard Sit to supine: Independent      Transfers Overall transfer level: Needs assistance Equipment used: Rolling walker (2 wheeled) Transfers: Sit to/from Stand              Ambulation/Gait Ambulation/Gait assistance: Herbalist (Feet): 150 Feet Assistive device: Rolling walker (2 wheeled) Gait Pattern/deviations: WFL(Within Functional Limits) Gait velocity: decreased   General Gait Details: no LOB, reports fatigue  Stairs            Wheelchair Mobility    Modified Rankin (Stroke Patients Only)       Balance Overall balance assessment: Needs assistance Sitting-balance support: Feet supported Sitting balance-Leahy Scale: Normal     Standing balance support: Bilateral  upper extremity supported;During functional activity Standing balance-Leahy Scale: Fair                               Pertinent Vitals/Pain Pain Assessment: No/denies pain    Home Living Family/patient expects to be discharged to:: Private residence Living Arrangements: Alone Available Help at Discharge: Friend(s) Type of Home: House Home Access: Stairs to enter   Technical brewer of Steps: 1 Home Layout: One level Home Equipment: Cane - single point      Prior Function Level of Independence: Independent with assistive device(s)         Comments: reports she used cane when outside of the home, drives     Hand Dominance        Extremity/Trunk Assessment   Upper Extremity Assessment Upper Extremity Assessment: Overall WFL for tasks assessed;Generalized weakness    Lower Extremity Assessment Lower Extremity Assessment: Overall WFL for tasks assessed;Generalized weakness    Cervical / Trunk Assessment Cervical / Trunk Assessment: Normal  Communication   Communication: No difficulties  Cognition Arousal/Alertness: Awake/alert Behavior During Therapy: WFL for tasks assessed/performed Overall Cognitive Status: Within Functional Limits for tasks assessed                                        General Comments      Exercises     Assessment/Plan    PT Assessment  Patient needs continued PT services  PT Problem List Decreased strength;Decreased activity tolerance       PT Treatment Interventions Gait training;Therapeutic activities;Therapeutic exercise;Stair training;Functional mobility training;Patient/family education    PT Goals (Current goals can be found in the Care Plan section)  Acute Rehab PT Goals Patient Stated Goal: to return home PT Goal Formulation: With patient Time For Goal Achievement: 10/13/19 Potential to Achieve Goals: Good    Frequency Min 3X/week   Barriers to discharge Decreased caregiver  support      Co-evaluation               AM-PAC PT "6 Clicks" Mobility  Outcome Measure Help needed turning from your back to your side while in a flat bed without using bedrails?: None Help needed moving from lying on your back to sitting on the side of a flat bed without using bedrails?: A Little Help needed moving to and from a bed to a chair (including a wheelchair)?: A Little Help needed standing up from a chair using your arms (e.g., wheelchair or bedside chair)?: A Little Help needed to walk in hospital room?: A Little Help needed climbing 3-5 steps with a railing? : A Little 6 Click Score: 19    End of Session Equipment Utilized During Treatment: Gait belt Activity Tolerance: Patient tolerated treatment well Patient left: in bed;with call bell/phone within reach Nurse Communication: Mobility status PT Visit Diagnosis: Muscle weakness (generalized) (M62.81)    Time: 1450-1516 PT Time Calculation (min) (ACUTE ONLY): 26 min   Charges:   PT Evaluation $PT Eval Moderate Complexity: 1 Mod PT Treatments $Gait Training: 8-22 mins        Crystle Carelli, PT, GCS 10/06/19,3:32 PM

## 2019-10-06 NOTE — ED Notes (Signed)
Pt has been unable to urinate  ?

## 2019-10-06 NOTE — Progress Notes (Signed)
[facility] PROGRESS NOTE  Olivia Sweeney [id number] DOB: 09/04/1966 DOA: 10/05/2019 PCP: Patient, No Pcp Per  Assessment/Plan: ARF. Likely related to decreased oral intake due to ?etoh use and UTI. Renal US distended bladder with no thickening, abdominal US with stones and sludge within gallbladder without gall bladder wall thickening. Creatinine 1.76 on admission and trending down today.  -continue iv fluids -hold nephrotoxins -rocephin -monitor urine output -recheck in am.   UTI. Patient is afebrile and non-toxic appearing.  -iv fluids -rocephin -follow urine culture  Hypokalemia. On admission potassium 4.0. this am potassium 3.0. -check mag level -replete -recheck in am  Abnormal liver function. Likely related to etoh use. ast 117 and total bili 2.2 on admission. Improved this am. Ck slightly elevated. Hepatitis panel non-reactive. Abdominal US as noted above.  -monitor -acetaminophine   AG acidosis secondary to Alcoholic Ketoacidosis. CO2 12 with a gap of 25 on admission. This am Co2 20 with a gap of 16.  -continue IV fluids -? ETOH abuse  Etoh use. Drink of choice vodka. Drinks consistently but not everyday. No s/sx withdrawal. Reports last drink 6 days ago. -CIWA -nutritional consult. Suspect her issues more closely related to not eating vs excessive drinking. Will check pre-albumin  ? Dm2 reports never diagnoses. Has one or two episodes of hyperglycemia years ago and resolved with diet. HgA1c 5.6.  -fsbs ac and qhs, ISS  Generalized weakness. Patient reports this is why she came to hospital as "unable to walk". Chart review indicates she was urinating on self and not eating.  -physical therapy -nutritional consult  Code Status: full Family Communication: patient Disposition Plan: home hopefully tomorrow   Consultants:    Procedures:    Antibiotics:  Rocephin 10/06/19>>  HPI/Subjective: Sitting on side of bed. Denies  pain/discomfort  Objective: Vitals:   10/06/19 0744 10/06/19 0912  BP: 113/68 135/87  Pulse: 90 90  Resp:  18  Temp:  98.1 F (36.7 C)  SpO2:  100%    Intake/Output Summary (Last 24 hours) at 10/06/2019 1008 Last data filed at 10/06/2019 0019 Gross per 24 hour  Intake 1975 ml  Output -  Net 1975 ml   Filed Weights   10/05/19 1516  Weight: 61.2 kg    Exam:   General:  Awake alert thin somewhat frail  Cardiovascular: rrr no mgr no LE edema  Respiratory: normal effort no wheeze rhonchi  Abdomen: non-distended non tender  Musculoskeletal: joints without swelling/erythema   Data Reviewed: Basic Metabolic Panel: Recent Labs  Lab 10/05/19 1525 10/05/19 2102 10/06/19 0500  NA 133* 135 140  K 4.0 3.1* 3.0*  CL 96*  --  104  CO2 12*  --  20*  GLUCOSE 122*  --  87  BUN 25*  --  18  CREATININE 1.76*  --  1.28*  CALCIUM 9.3  --  8.3*   Liver Function Tests: Recent Labs  Lab 10/05/19 1525 10/06/19 0500  AST 117* 63*  ALT 43 30  ALKPHOS 47 38  BILITOT 2.2* 1.9*  PROT 7.1 5.3*  ALBUMIN 3.9 2.8*   No results for input(s): LIPASE, AMYLASE in the last 168 hours. No results for input(s): AMMONIA in the last 168 hours. CBC: Recent Labs  Lab 10/05/19 1525 10/05/19 2102 10/06/19 0500  WBC 8.7  --  5.9  NEUTROABS 7.2  --   --   HGB 12.5 17.3* 10.3*  HCT 38.4 51.0* 30.8*  MCV 95.0  --  92.8  PLT 215  --  172   Cardiac Enzymes: Recent Labs  Lab 10/05/19 1525 10/06/19 0500  CKTOTAL 613* 253*  CKMB 61.8* 24.4*   BNP (last 3 results) No results for input(s): BNP in the last 8760 hours.  ProBNP (last 3 results) No results for input(s): PROBNP in the last 8760 hours.  CBG: Recent Labs  Lab 10/05/19 1659 10/05/19 2159 10/05/19 2229 10/05/19 2344 10/06/19 0738  GLUCAP 113* 55* 64* 254* 77    Recent Results (from the past 240 hour(s))  Culture, blood (routine x 2)     Status: None (Preliminary result)   Collection Time: 10/05/19  8:41 PM    Specimen: BLOOD RIGHT HAND  Result Value Ref Range Status   Specimen Description BLOOD RIGHT HAND  Final   Special Requests   Final    BOTTLES DRAWN AEROBIC AND ANAEROBIC Blood Culture adequate volume   Culture   Final    NO GROWTH < 12 HOURS Performed at [facility], [place] [address], [place], [place] [address]    Report Status PENDING  Incomplete  Culture, blood (routine x 2)     Status: None (Preliminary result)   Collection Time: 10/05/19  8:51 PM   Specimen: BLOOD LEFT WRIST  Result Value Ref Range Status   Specimen Description BLOOD LEFT WRIST  Final   Special Requests   Final    BOTTLES DRAWN AEROBIC AND ANAEROBIC Blood Culture adequate volume   Culture   Final    NO GROWTH < 12 HOURS Performed at [facility], [place] [address], [place], [place] [address]    Report Status PENDING  Incomplete  SARS CORONAVIRUS 2 (TAT 6-24 HRS) Nasopharyngeal Nasopharyngeal Swab     Status: None   Collection Time: 10/05/19 11:58 PM   Specimen: Nasopharyngeal Swab  Result Value Ref Range Status   SARS Coronavirus 2 NEGATIVE NEGATIVE Final    Comment: (NOTE) SARS-CoV-2 target nucleic acids are NOT DETECTED. The SARS-CoV-2 RNA is generally detectable in upper and lower respiratory specimens during the acute phase of infection. Negative results do not preclude SARS-CoV-2 infection, do not rule out co-infections with other pathogens, and should not be used as the sole basis for treatment or other patient management decisions. Negative results must be combined with clinical observations, patient history, and epidemiological information. The expected result is Negative. Fact Sheet for Patients: SugarRoll.be Fact Sheet for Healthcare Providers: https://www.woods-mathews.com/ This test is not yet approved or cleared by the [place] FDA and  has been authorized for detection and/or diagnosis of SARS-CoV-2 by FDA under an Emergency Use  Authorization (EUA). This EUA will remain  in effect (meaning this test can be used) for the duration of the COVID-19 declaration under Section 56 4(b)(1) of the Act, 21 U.S.C. section 360bbb-3(b)(1), unless the authorization is terminated or revoked sooner. Performed at [facility], [place] [address], [place], [place] [address]      Studies: Dg Chest 2 View  Result Date: 10/05/2019 CLINICAL DATA:  Weakness EXAM: CHEST - 2 VIEW COMPARISON:  Chest CT 07/02/2017, chest radiograph 03/06/2013 FINDINGS: No consolidation, features of edema, pneumothorax, or effusion. Pulmonary vascularity is normally distributed. The cardiomediastinal contours are unremarkable. No acute osseous or soft tissue abnormality. Partially visualized lower cervical ACDF. IMPRESSION: No acute cardiopulmonary abnormality. Electronically Signed   By: Lovena Le M.D.   On: 10/05/2019 16:16   Ct Head Wo Contrast  Result Date: 10/05/2019 CLINICAL DATA:  Weakness and difficulty walking EXAM: CT HEAD WITHOUT CONTRAST  TECHNIQUE: Contiguous axial images were obtained from the base of the skull through the vertex without intravenous contrast. COMPARISON:  None. FINDINGS: Brain: There is no mass, hemorrhage or extra-axial collection. The size and configuration of the ventricles and extra-axial CSF spaces are normal. There is hypoattenuation of the white matter, most commonly indicating chronic small vessel disease. There is an old right basal ganglia small vessel infarct. Vascular: No abnormal hyperdensity of the major intracranial arteries or dural venous sinuses. No intracranial atherosclerosis. Skull: The visualized skull base, calvarium and extracranial soft tissues are normal. Sinuses/Orbits: No fluid levels or advanced mucosal thickening of the visualized paranasal sinuses. No mastoid or middle ear effusion. The orbits are normal. IMPRESSION: No acute intracranial abnormality. Electronically Signed   By: Ulyses Jarred M.D.   On:  10/05/2019 21:39   US Renal  Result Date: 10/06/2019 CLINICAL DATA:  Acute kidney injury. EXAM: RENAL / URINARY TRACT ULTRASOUND COMPLETE COMPARISON:  None. FINDINGS: Right Kidney: Renal measurements: 8.6 x 4.6 x 5.1 cm = volume: 103 mL. Echogenicity within normal limits. No mass or hydronephrosis visualized. Left Kidney: Renal measurements: 8.3 x 4.6 x 5.0 cm = volume: 98.3 mL. Echogenicity within normal limits. No mass or hydronephrosis visualized. Bladder: Distended without wall thickening or focal abnormality. Neither ureteral jet is visualized. Other: None. IMPRESSION: 1. Normal sonographic appearance of the kidneys. 2. Distended urinary bladder without wall thickening. Electronically Signed   By: Keith Rake M.D.   On: 10/06/2019 03:57   US Abdomen Limited Ruq  Result Date: 10/05/2019 CLINICAL DATA:  Abnormal liver function. Additional history provided: History of diabetes mellitus. EXAM: ULTRASOUND ABDOMEN LIMITED RIGHT UPPER QUADRANT COMPARISON:  No pertinent prior studies available for comparison. FINDINGS: The patient was unable to reposition during the examination, somewhat limiting evaluation. Gallbladder: No gallbladder wall thickening. Sludge and stones within the gallbladder lumen. No sonographic Percell Miller sign was elicited by the scanning technologist. Common bile duct: Diameter: Visualized proximal common duct measures 3-4 mm, within normal limits. Liver: No focal lesion identified. Within normal limits in parenchymal echogenicity. Interrogated portal vein is patent on color Doppler imaging with normal direction of blood flow towards the liver. IMPRESSION: Stones and sludge within the gallbladder without gallbladder wall thickening or sonographic Murphy sign. Otherwise unremarkable right upper quadrant ultrasound, as detailed. Electronically Signed   By: Kellie Simmering DO   On: 10/05/2019 21:32    Scheduled Meds: . enoxaparin (LOVENOX) injection  40 mg Subcutaneous Q24H  . folic acid  1  mg Oral Daily  . insulin aspart  0-5 Units Subcutaneous QHS  . insulin aspart  0-9 Units Subcutaneous TID WC  . LORazepam  0-4 mg Intravenous Q6H   Followed by  . [START ON 10/08/2019] LORazepam  0-4 mg Intravenous Q12H  . multivitamin with minerals  1 tablet Oral Daily  . potassium chloride  40 mEq Oral Q4H  . thiamine  100 mg Oral Daily   Or  . thiamine  100 mg Intravenous Daily   Continuous Infusions: . sodium chloride    . cefTRIAXone (ROCEPHIN)  IV Stopped (10/06/19 0831)    Principal Problem:   ARF (acute renal failure) (HCC) Active Problems:   Abnormal liver function   Alcoholic ketoacidosis   UTI (urinary tract infection)   Generalized weakness   Diabetes (HCC)   Hypokalemia   Gallstones   Lynch syndrome   ETOH abuse    Time spent: 45 minutes    Radene Gunning NP [facility]  If 7PM-7AM, please  contact night-coverage at [email or web address], password [facility] 10/06/2019, 10:08 AM  LOS: 0 days

## 2019-10-06 NOTE — Progress Notes (Signed)
Initial Nutrition Assessment  DOCUMENTATION CODES:   Not applicable  INTERVENTION:   -Boost Breeze po TID, each supplement provides 250 kcal and 9 grams of protein -MVI with minerals daily  NUTRITION DIAGNOSIS:   Increased nutrient needs related to chronic illness(lynch syndrome with uterine cancer) as evidenced by estimated needs.  GOAL:   Patient will meet greater than or equal to 90% of their needs  MONITOR:   PO intake, Supplement acceptance, Labs, Weight trends, Skin, I & O's  REASON FOR ASSESSMENT:   Consult Assessment of nutrition requirement/status  ASSESSMENT:   Olivia Sweeney  is a 53 y.o. female, w dm2, Lynch syndrome w uterine cancer presents with generalized weakness,  Not feeling well. Pt denies fever, chills, cough, cp, palp, sob, abd pain, n/v, diarrhea, brbpr, black stool, dysuria, hematuria,  Pt admits to drinking etoh.  Pt is not really forthcoming about how much that she drinks daily.  Last drink was this past Wednesday.  Pt admitted with ARF.   Reviewed I/O's: +2 L x 24 hours  Attempted to speak with pt at bedside, however, pt on phone and did not interact with RD or disengage in conversation.  Per chart review, pt with history of ETOH abuse (last drink was last Wednesday). Pt with uneaten food and urinating on herself on her porch PTA as witnessed by neighbors.   Obtained bedscale wt; noted wt of 49.1 kg.   Observed lunch tray- pt consumed 90% of hamburger and 100% of diet coke.   Suspect poor diet quality PTA secondary to ETOH abuse. RD will order nutritional supplements to promote adequate oral intake.   Medications reviewed and include ativan, MVI, thiamine, folic acid, [id number] sodium chloride infusion @ 100 ml/hr.   Albumin has a half-life of 21 days and is strongly affected by stress response and inflammatory process, therefore, do not expect to see an improvement in this lab value during acute hospitalization. When a patient presents with low  albumin, it is likely skewed due to the acute inflammatory response.  Unless it is suspected that patient had poor PO intake or malnutrition prior to admission, then RD should not be consulted solely for low albumin. Note that low albumin is no longer used to diagnose malnutrition; [place] uses the new malnutrition guidelines published by the American Society for Parenteral and Enteral Nutrition (A.S.P.E.N.) and the Academy of Nutrition and Dietetics (AND).    Lab Results  Component Value Date   HGBA1C 5.2 10/05/2019   PTA DM medications are none.   Labs reviewed: Mg: 1.2, CBGS: 77-125 (inpatient orders for glycemic control are 0-5 units insulin aspart q HS, 0-9 units insulin aspart TID with meals,).   NUTRITION - FOCUSED PHYSICAL EXAM:    Most Recent Value  Orbital Region  No depletion  Upper Arm Region  Mild depletion  Thoracic and Lumbar Region  No depletion  Buccal Region  No depletion  Temple Region  Mild depletion  Clavicle Bone Region  No depletion  Clavicle and Acromion Bone Region  No depletion  Scapular Bone Region  Mild depletion  Dorsal Hand  Mild depletion  Patellar Region  Mild depletion  Anterior Thigh Region  Mild depletion  Posterior Calf Region  Mild depletion  Edema (RD Assessment)  None  Hair  Reviewed  Eyes  Reviewed  Mouth  Reviewed  Skin  Reviewed  Nails  Reviewed       Diet Order:   Diet Order  Diet regular Room service appropriate? Yes; Fluid consistency: Thin  Diet effective now              EDUCATION NEEDS:   No education needs have been identified at this time  Skin:  Skin Assessment: Reviewed RN Assessment  Last BM:  Unknown  Height:   Ht Readings from Last 1 Encounters:  10/06/19 5\' 4"  (1.626 m)    Weight:   Wt Readings from Last 1 Encounters:  10/06/19 49.1 kg    Ideal Body Weight:  54.5 kg  BMI:  Body mass index is 18.58 kg/m.  Estimated Nutritional Needs:   Kcal:  1500-1700  Protein:  75-90  grams  Fluid:  > 1.5 L    Petra Dumler A. Jimmye Norman, RD, LDN, [place] Registered Dietitian II Certified Diabetes Care and Education Specialist Pager: [phone] After hours Pager: [phone]

## 2019-10-06 NOTE — ED Notes (Signed)
MS   Breakfast ordered  

## 2019-10-07 DIAGNOSIS — M6282 Rhabdomyolysis: Secondary | ICD-10-CM

## 2019-10-07 LAB — GLUCOSE, CAPILLARY
Glucose-Capillary: 150 mg/dL — ABNORMAL HIGH (ref 70–99)
Glucose-Capillary: 184 mg/dL — ABNORMAL HIGH (ref 70–99)
Glucose-Capillary: 230 mg/dL — ABNORMAL HIGH (ref 70–99)
Glucose-Capillary: 145 mg/dL — ABNORMAL HIGH (ref 70–99)

## 2019-10-07 LAB — BASIC METABOLIC PANEL
Anion gap: 8 (ref 5–15)
BUN: 15 mg/dL (ref 6–20)
CO2: 24 mmol/L (ref 22–32)
Calcium: 8.1 mg/dL — ABNORMAL LOW (ref 8.9–10.3)
Chloride: 109 mmol/L (ref 98–111)
Creatinine, Ser: 1.06 mg/dL — ABNORMAL HIGH (ref 0.44–1.00)
GFR calc Af Amer: >60 mL/min (ref >60)
GFR calc non Af Amer: 59 mL/min — ABNORMAL LOW (ref >60)
Glucose, Bld: 152 mg/dL — ABNORMAL HIGH (ref 70–99)
Potassium: 3.1 mmol/L — ABNORMAL LOW (ref 3.5–5.1)
Sodium: 141 mmol/L (ref 135–145)

## 2019-10-07 LAB — MAGNESIUM: Magnesium: 1.8 mg/dL (ref 1.7–2.4)

## 2019-10-07 LAB — PHOSPHORUS: Phosphorus: 1.2 mg/dL — ABNORMAL LOW (ref 2.5–4.6)

## 2019-10-07 MED ORDER — POTASSIUM CHLORIDE CRYS ER 20 MEQ PO TBCR
40.0000 meq | EXTENDED_RELEASE_TABLET | ORAL | Status: AC
  Administered 2019-10-07 (×2): 40 meq via ORAL
  Filled 2019-10-07 (×2): qty 2

## 2019-10-07 MED ORDER — K PHOS MONO-SOD PHOS DI & MONO 155-852-130 MG PO TABS
250.0000 mg | ORAL_TABLET | Freq: Once | ORAL | Status: AC
  Administered 2019-10-07: 250 mg via ORAL
  Filled 2019-10-07: qty 1

## 2019-10-07 MED ORDER — K PHOS MONO-SOD PHOS DI & MONO 155-852-130 MG PO TABS
500.0000 mg | ORAL_TABLET | Freq: Once | ORAL | Status: DC
  Filled 2019-10-07: qty 2

## 2019-10-07 MED ORDER — SODIUM CHLORIDE 0.9 % IV SOLN
1.0000 g | INTRAVENOUS | Status: DC
  Administered 2019-10-07: 17:00:00 1 g via INTRAVENOUS
  Filled 2019-10-07: qty 10
  Filled 2019-10-07: qty 1

## 2019-10-07 NOTE — Progress Notes (Signed)
PROGRESS NOTE  Olivia Sweeney [id number] DOB: 1965-12-25 DOA: 10/05/2019 PCP: Patient, No Pcp Per  Brief summary:  ITXEL WOMBACHER is a 53 y.o. female was brought to the hospital after a friend was concerned that she was urinating on herself and had un-eaten food on the porch.  Patient has a h/o anorexia and heavy alcohol use.  She was found to have AKI, low K, low Mg.  HPI/Recap of past 24 hours:   Assessment/Plan: Principal Problem:   ARF (acute renal failure) (HCC) Active Problems:   Diabetes (HCC)   Lynch syndrome   Abnormal liver function   Alcoholic ketoacidosis   UTI (urinary tract infection)   Hypokalemia   Gallstones   ETOH abuse   Generalized weakness  Acute renal failure on CKD 2 -Likely from dehydration, mild rhabdomyolysis and possible UTI -UA has positive nitrite, many bacteria, urine culture pending, blood culture no growth, no fever, no leukocytosis, will continue Rocephin that was started on presentation, follow-up on urine culture result, low threshold to discontinue Rocephin -Renal ultrasound "1. Normal sonographic appearance of the kidneys. 2. Distended urinary bladder without wall thickening." -add on bladder scan to check post void residual -Creatinine improving, she is off IV fluids, encourage oral intake  Mild rhabdomyolysis -Likely due to prolonged immobilization at home -CK on presentation 613, trending down  Abnormal liver function. Likely related to etoh use. ast 117 and total bili 2.2 on admission CK mildly the elevated, trending down Hepatitis panel negative, HIV screening negative abdominal US with stones and sludge within gallbladder without gall bladder wall thickening  LFT improving  Hypokalemia, hypomagnesemia, hypophosphatemia -Replace, keep on telemetry  AG acidosissecondary to Alcoholic Ketoacidosis. CO2 12 with a gap of 25 on admission -Received hydration, resolved  Normocytic anemia hgb appears at baseline about 10  -No overt sign of bleeding -We will add on occult blood testing  Etoh use. Drink of choice vodka. Drinks consistently but not everyday. No s/sx withdrawal. Reports last drink 6 days ago.  Reports some day drink one , some day drinks 5 -CIWA, folic acid and thiamine supplement  Impaired fasting blood glucose -Questionable history of diabetes, she is not on any medication prior to hospitalization -A1c 5.2 this time -Currently on SSI -Change diet to carb modified -Recommend follow-up with PCP  Generalized weakness/FTT. Patient reports this is why she came to hospital as "unable to walk". Chart review indicates she was urinating on self and not eating.  -physical therapy, she ambulated this am -nutritional consult -case manager  Reported history of weight loss, but report weight has been stable for the last 4 years  History of endometrial cancer status post hysterectomy  no history of chemo or radiation  H/o genetically proven Lynch Syndrome, last colonoscopy and EGD in 2018 She is followed by GI Dr. Owens Loffler   DVT Prophylaxis: Lovenox  Code Status: Full  Family Communication: patient   Disposition Plan: Home with home health in 1 to 2 days, pending lab work improvement   Consultants:  None  Procedures:  None  Antibiotics:  Rocephin   Objective: BP 123/75 (BP Location: Right Arm)   Pulse 81   Temp 98.7 F (37.1 C) (Oral)   Resp 16   Ht 5\' 4"  (1.626 m)   Wt 47.9 kg   LMP 02/16/2013   SpO2 100%   BMI 18.13 kg/m   Intake/Output Summary (Last 24 hours) at 10/07/2019 1518 Last data filed at 10/07/2019 0540 Gross per 24 hour  Intake 713.92 ml  Output 600 ml  Net 113.92 ml   Filed Weights   10/05/19 1516 10/06/19 1357 10/07/19 0539  Weight: 61.2 kg 49.1 kg 47.9 kg    Exam: Patient is examined daily including today on 10/07/2019, exams remain the same as of yesterday except that has changed    General:  NAD  Cardiovascular: RRR  Respiratory:  CTABL  Abdomen: Soft/ND/NT, positive BS  Musculoskeletal: No Edema  Neuro: alert, oriented   Data Reviewed: Basic Metabolic Panel: Recent Labs  Lab 10/05/19 1525 10/05/19 2102 10/06/19 0500 10/06/19 1029 10/07/19 0557  NA 133* 135 140  --  141  K 4.0 3.1* 3.0*  --  3.1*  CL 96*  --  104  --  109  CO2 12*  --  20*  --  24  GLUCOSE 122*  --  87  --  152*  BUN 25*  --  18  --  15  CREATININE 1.76*  --  1.28*  --  1.06*  CALCIUM 9.3  --  8.3*  --  8.1*  MG  --   --   --  1.2* 1.8  PHOS  --   --   --   --  1.2*   Liver Function Tests: Recent Labs  Lab 10/05/19 1525 10/06/19 0500  AST 117* 63*  ALT 43 30  ALKPHOS 47 38  BILITOT 2.2* 1.9*  PROT 7.1 5.3*  ALBUMIN 3.9 2.8*   No results for input(s): LIPASE, AMYLASE in the last 168 hours. No results for input(s): AMMONIA in the last 168 hours. CBC: Recent Labs  Lab 10/05/19 1525 10/05/19 2102 10/06/19 0500  WBC 8.7  --  5.9  NEUTROABS 7.2  --   --   HGB 12.5 17.3* 10.3*  HCT 38.4 51.0* 30.8*  MCV 95.0  --  92.8  PLT 215  --  172   Cardiac Enzymes:   Recent Labs  Lab 10/05/19 1525 10/06/19 0500  CKTOTAL 613* 253*  CKMB 61.8* 24.4*   BNP (last 3 results) No results for input(s): BNP in the last 8760 hours.  ProBNP (last 3 results) No results for input(s): PROBNP in the last 8760 hours.  CBG: Recent Labs  Lab 10/06/19 1107 10/06/19 1622 10/06/19 2130 10/07/19 0559 10/07/19 1100  GLUCAP 125* 189* 162* 150* 184*    Recent Results (from the past 240 hour(s))  Culture, blood (routine x 2)     Status: None (Preliminary result)   Collection Time: 10/05/19  8:41 PM   Specimen: BLOOD RIGHT HAND  Result Value Ref Range Status   Specimen Description BLOOD RIGHT HAND  Final   Special Requests   Final    BOTTLES DRAWN AEROBIC AND ANAEROBIC Blood Culture adequate volume   Culture   Final    NO GROWTH 2 DAYS Performed at [facility], [place] [address], [place], [place] [address]    Report Status  PENDING  Incomplete  Culture, blood (routine x 2)     Status: None (Preliminary result)   Collection Time: 10/05/19  8:51 PM   Specimen: BLOOD LEFT WRIST  Result Value Ref Range Status   Specimen Description BLOOD LEFT WRIST  Final   Special Requests   Final    BOTTLES DRAWN AEROBIC AND ANAEROBIC Blood Culture adequate volume   Culture   Final    NO GROWTH 2 DAYS Performed at [facility], [place] [address], [place], [place] [address]    Report Status  PENDING  Incomplete  SARS CORONAVIRUS 2 (TAT 6-24 HRS) Nasopharyngeal Nasopharyngeal Swab     Status: None   Collection Time: 10/05/19 11:58 PM   Specimen: Nasopharyngeal Swab  Result Value Ref Range Status   SARS Coronavirus 2 NEGATIVE NEGATIVE Final    Comment: (NOTE) SARS-CoV-2 target nucleic acids are NOT DETECTED. The SARS-CoV-2 RNA is generally detectable in upper and lower respiratory specimens during the acute phase of infection. Negative results do not preclude SARS-CoV-2 infection, do not rule out co-infections with other pathogens, and should not be used as the sole basis for treatment or other patient management decisions. Negative results must be combined with clinical observations, patient history, and epidemiological information. The expected result is Negative. Fact Sheet for Patients: SugarRoll.be Fact Sheet for Healthcare Providers: https://www.woods-mathews.com/ This test is not yet approved or cleared by the [place] FDA and  has been authorized for detection and/or diagnosis of SARS-CoV-2 by FDA under an Emergency Use Authorization (EUA). This EUA will remain  in effect (meaning this test can be used) for the duration of the COVID-19 declaration under Section 56 4(b)(1) of the Act, 21 U.S.C. section 360bbb-3(b)(1), unless the authorization is terminated or revoked sooner. Performed at [facility], [place] [address] [place], [place] [address]       Studies: No results found.  Scheduled Meds: . enoxaparin (LOVENOX) injection  40 mg Subcutaneous Q24H  . feeding supplement  1 Container Oral TID BM  . folic acid  1 mg Oral Daily  . insulin aspart  0-5 Units Subcutaneous QHS  . insulin aspart  0-9 Units Subcutaneous TID WC  . LORazepam  0-4 mg Intravenous Q6H   Followed by  . [START ON 10/08/2019] LORazepam  0-4 mg Intravenous Q12H  . multivitamin with minerals  1 tablet Oral Daily  . phosphorus  250 mg Oral Once  . phosphorus  500 mg Oral Once  . thiamine  100 mg Oral Daily   Or  . thiamine  100 mg Intravenous Daily    Continuous Infusions:   Time spent: 20mins I have personally reviewed and interpreted on  10/07/2019 daily labs, tele strips, imagings as discussed above under date review session and assessment and plans.  I reviewed all nursing notes, pharmacy notes,   vitals, pertinent old records  I have discussed plan of care as described above with RN , patient  on 10/07/2019   Florencia Reasons MD, PhD, FACP  [facility] Pager [phone]. If 7PM-7AM, please contact night-coverage at [email or web address], password [facility] 10/07/2019, 3:18 PM  LOS: 1 day

## 2019-10-07 NOTE — TOC Initial Note (Signed)
Transition of Care [facility]) - Initial/Assessment Note    Patient Details  Name: DEVOTA BRADY MRN: [id number] Date of Birth: August 02, 1966  Transition of Care [facility]) CM/SW Contact:    Alexander [place], [place] Phone Number: 10/07/2019, 3:24 PM  Clinical Narrative:                 CSW reached pt on room phone for initial assessment. Introduced self, role, reason for call. Pt from home, confirmed home address and cell #. She "thought I had insurance but I guess not." She has not been active with a primary care physician recently. Pt has a close friend as her contact but denies any additional contacts. She states that in the past few years she has lost numerous family members. She describes having had a tough time dealing with so much loss and has not been managing her own health. She admits to drinking between 1-5 drinks daily but has not had "a drink in week." She has two dogs at home which she is obviously very fond of.   We discussed recommendations for HHPT and other services as well as a rolling walker and 3 in 1. Pt agreeable to rolling walker but declines HH and 3 in 1. She admits "I am a hoarder" and expressed discomfort in having someone come to her home.   We discussed referral to PCP at one of [facility] clinics. She is agreeable to this appointment being made. She states when she is ready she will have a ride home.   CSW will request assistance of RNCM to change pt to [place] if needed for discharge.   CSW made an appointment at January 12th at [place] at the [facility]; this will be added to pt AVS.   Expected Discharge Plan: Home/Self Care Barriers to Discharge: Continued Medical Work up   Patient Goals and CMS Choice Patient states their goals for this hospitalization and ongoing recovery are:: to get some things together at home CMS Medicare.gov Compare Post Acute Care list provided to:: (pt declined HH) Choice offered to / list presented to : Patient  Expected  Discharge Plan and Services Expected Discharge Plan: Home/Self Care In-house Referral: Clinical Social Work Discharge Planning Services: CM Consult Post Acute Care Choice: Durable Medical Equipment Living arrangements for the past 2 months: Single Family Home             DME Arranged: Walker rolling DME Agency: [employer] Representative spoke with at DME Agency: [place] Arranged: Refused [place]  Prior Living Arrangements/Services Living arrangements for the past 2 months: Single Family Home Lives with:: Self, Pets Patient language and need for interpreter reviewed:: Yes(no needs) Do you feel safe going back to the place where you live?: Yes      Need for Family Participation in Patient Care: Yes (Comment)(support and assistance as needed) Care giver support system in place?: Yes (comment)(pt friend)   Criminal Activity/Legal Involvement Pertinent to Current Situation/Hospitalization: No - Comment as needed  Activities of Daily Living Home Assistive Devices/Equipment: Cane (specify quad or straight) ADL Screening (condition at time of admission) Patient's cognitive ability adequate to safely complete daily activities?: Yes Is the patient deaf or have difficulty hearing?: No Does the patient have difficulty seeing, even when wearing glasses/contacts?: No Does the patient have difficulty concentrating, remembering, or making decisions?: No Patient able to express need for assistance with ADLs?: Yes Does the patient have difficulty dressing or bathing?: No Independently performs ADLs?: Yes (appropriate for  developmental age) Does the patient have difficulty walking or climbing stairs?: Yes Weakness of Legs: Both Weakness of Arms/Hands: Both  Permission Sought/Granted Permission sought to share information with : Other (comment)(pt friend) Permission granted to share information with : No  Share Information with NAME: Clent Ridges     Permission granted to share info w  Relationship: friend  Permission granted to share info w Contact Information: [phone]  Emotional Assessment Appearance:: Other (Comment Required(telephonic assessment) Attitude/Demeanor/Rapport: Engaged(telephonic assessment) Affect (typically observed): Overwhelmed Orientation: : Oriented to Self, Oriented to Place, Oriented to  Time, Oriented to Situation Alcohol / Substance Use: Alcohol Use Psych Involvement: No (comment)  Admission diagnosis:  ARF (acute renal failure) ([place]) [N17.9] Abnormal liver function [R94.5] Patient Active Problem List   Diagnosis Date Noted  . Alcoholic ketoacidosis [id number]  . UTI (urinary tract infection) 10/06/2019  . Hypokalemia 10/06/2019  . Gallstones 10/06/2019  . Generalized weakness 10/06/2019  . ETOH abuse   . ARF (acute renal failure) ([place]) 10/05/2019  . Abnormal liver function 10/05/2019  . Genetic testing 12/16/2014  . Lynch syndrome 09/03/2013  . Endometrial ca ([place]) 03/17/2013  . Shoulder pain 01/01/2012  . Diabetes ([place]) 11/13/2011   PCP:  Patient, No Pcp Per Pharmacy:   [id number] [address], [place] [address] Phone: [phone] Fax: [phone]  [id number] [address], [place] [address] Phone: [phone] Fax: [phone]  [facility] [place], [place] - [address] [place] [address] Phone: [phone] Fax: [phone]  [facility] [place], [place] - [address] [place] [address] Phone: [phone] Fax: [phone]   Readmission Risk Interventions Readmission Risk Prevention Plan 10/07/2019  Transportation Screening Complete  PCP or Specialist Appt within 5-7 Days Complete  Home Care Screening Complete  Medication Review (RN CM) Referral to Pharmacy  Some recent data might be hidden

## 2019-10-07 NOTE — Progress Notes (Signed)
Physical Therapy Treatment Patient Details Name: Olivia Sweeney MRN: [id number] DOB: 12-30-1965 Today's Date: 10/07/2019    History of Present Illness 53 year old who was admitted due to weakness and not feeling well. In further notes it is found that a friend had her come due to that she was not eating and was urinating on herself. PMH to include: ETOH use, uterine Ca, Lynch syndrome.    PT Comments    Patient progressing with ambulation distance and able to get up with less assistance this session.  Covered multiple aspects of safety for d/c home with euipment and not falling.  She needs as much support as possible from [facility] services to be successful at home.  PT to continue to follow acutely.   Follow Up Recommendations  Home health PT(HH aide/SW follow up)     Equipment Recommendations  Rolling walker with 5" wheels;3in1 (PT)    Recommendations for Other Services       Precautions / Restrictions Precautions Precautions: Fall Restrictions Weight Bearing Restrictions: No    Mobility  Bed Mobility Overal bed mobility: Modified Independent                Transfers Overall transfer level: Modified independent Equipment used: Rolling walker (2 wheeled)   Sit to Stand: Supervision;Modified independent (Device/Increase time)         General transfer comment: initially S for safety as pt pulling up on walker, after practicing 5x sit to stand performed with improved technique and safety  Ambulation/Gait Ambulation/Gait assistance: Supervision Gait Distance (Feet): 220 Feet Assistive device: Rolling walker (2 wheeled) Gait Pattern/deviations: Step-to pattern;Step-through pattern;Shuffle;Decreased stride length     General Gait Details: gait with RW and S for safety, no LOB, slow but safe with turns, short shuffling steps   Stairs Stairs: (demonstrated up one step with RW)           Wheelchair Mobility    Modified Rankin (Stroke Patients Only)        Balance Overall balance assessment: Needs assistance   Sitting balance-Leahy Scale: Good       Standing balance-Leahy Scale: Fair Standing balance comment: can stand without UE support, but needs RW for ambulation                            Cognition Arousal/Alertness: Awake/alert Behavior During Therapy: WFL for tasks assessed/performed;Anxious Overall Cognitive Status: Within Functional Limits for tasks assessed                                 General Comments: tearful talking about stressors in her life that have led to her current health crisis      Exercises Other Exercises Other Exercises: sit<>stand x 5 reps with cues for technique    General Comments General comments (skin integrity, edema, etc.): Educated on fall prevention for home; pt admits to hoarding at home and educated on decluttering pathway for RW and demonstrated side stepping through smaller spaces with RW; also touched on energy conservation      Pertinent Vitals/Pain Pain Assessment: No/denies pain    Home Living                      Prior Function            PT Goals (current goals can now be found in the care plan section) Progress towards  PT goals: Progressing toward goals    Frequency    Min 3X/week      PT Plan Discharge plan needs to be updated    Co-evaluation              AM-PAC PT "6 Clicks" Mobility   Outcome Measure  Help needed turning from your back to your side while in a flat bed without using bedrails?: None Help needed moving from lying on your back to sitting on the side of a flat bed without using bedrails?: None Help needed moving to and from a bed to a chair (including a wheelchair)?: A Little Help needed standing up from a chair using your arms (e.g., wheelchair or bedside chair)?: None Help needed to walk in hospital room?: None Help needed climbing 3-5 steps with a railing? : A Little 6 Click Score: 22    End of  Session   Activity Tolerance: Patient tolerated treatment well Patient left: in bed;with call bell/phone within reach;with bed alarm set   PT Visit Diagnosis: Muscle weakness (generalized) (M62.81)     Time: 1020-1100 PT Time Calculation (min) (ACUTE ONLY): 40 min  Charges:  $Gait Training: 8-22 mins $Therapeutic Exercise: 8-22 mins $Self Care/Home Management: [place], [place] [phone] 10/07/2019    Reginia Naas 10/07/2019, 11:07 AM

## 2019-10-07 NOTE — Social Work (Signed)
CSW attempted to reach pt via room phone.  Pt answered was amenable to speaking with CSW but states that her RN was in the room. CSW will reach out to pt later at her request.   Westley Hummer, MSW, [place] Work [phone]

## 2019-10-08 LAB — CBC WITH DIFFERENTIAL/PLATELET
Abs Immature Granulocytes: 0.05 10*3/uL (ref 0.00–0.07)
Basophils Absolute: 0 10*3/uL (ref 0.0–0.1)
Basophils Relative: 1 %
Eosinophils Absolute: 0.1 10*3/uL (ref 0.0–0.5)
Eosinophils Relative: 2 %
HCT: 29.2 % — ABNORMAL LOW (ref 36.0–46.0)
Hemoglobin: 9.7 g/dL — ABNORMAL LOW (ref 12.0–15.0)
Immature Granulocytes: 1 %
Lymphocytes Relative: 34 %
Lymphs Abs: 1.5 10*3/uL (ref 0.7–4.0)
MCH: 31.1 pg (ref 26.0–34.0)
MCHC: 33.2 g/dL (ref 30.0–36.0)
MCV: 93.6 fL (ref 80.0–100.0)
Monocytes Absolute: 0.5 10*3/uL (ref 0.1–1.0)
Monocytes Relative: 12 %
Neutro Abs: 2.2 10*3/uL (ref 1.7–7.7)
Neutrophils Relative %: 50 %
Platelets: UNDETERMINED 10*3/uL (ref 150–400)
RBC: 3.12 MIL/uL — ABNORMAL LOW (ref 3.87–5.11)
RDW: 14.5 % (ref 11.5–15.5)
WBC: 4.4 10*3/uL (ref 4.0–10.5)
nRBC: 0 % (ref 0.0–0.2)

## 2019-10-08 LAB — BASIC METABOLIC PANEL
Anion gap: 10 (ref 5–15)
BUN: 15 mg/dL (ref 6–20)
CO2: 27 mmol/L (ref 22–32)
Calcium: 8.4 mg/dL — ABNORMAL LOW (ref 8.9–10.3)
Chloride: 102 mmol/L (ref 98–111)
Creatinine, Ser: 0.87 mg/dL (ref 0.44–1.00)
GFR calc Af Amer: >60 mL/min (ref >60)
GFR calc non Af Amer: >60 mL/min (ref >60)
Glucose, Bld: 165 mg/dL — ABNORMAL HIGH (ref 70–99)
Potassium: 3.8 mmol/L (ref 3.5–5.1)
Sodium: 139 mmol/L (ref 135–145)

## 2019-10-08 LAB — MAGNESIUM: Magnesium: 1.4 mg/dL — ABNORMAL LOW (ref 1.7–2.4)

## 2019-10-08 LAB — HEPATIC FUNCTION PANEL
ALT: 23 U/L (ref 0–44)
AST: 27 U/L (ref 15–41)
Albumin: 2.8 g/dL — ABNORMAL LOW (ref 3.5–5.0)
Alkaline Phosphatase: 33 U/L — ABNORMAL LOW (ref 38–126)
Bilirubin, Direct: <0.1 mg/dL (ref 0.0–0.2)
Total Bilirubin: 0.7 mg/dL (ref 0.3–1.2)
Total Protein: 5.4 g/dL — ABNORMAL LOW (ref 6.5–8.1)

## 2019-10-08 LAB — GLUCOSE, CAPILLARY
Glucose-Capillary: 152 mg/dL — ABNORMAL HIGH (ref 70–99)
Glucose-Capillary: 194 mg/dL — ABNORMAL HIGH (ref 70–99)
Glucose-Capillary: 150 mg/dL — ABNORMAL HIGH (ref 70–99)

## 2019-10-08 LAB — PHOSPHORUS: Phosphorus: <1 mg/dL — CL (ref 2.5–4.6)

## 2019-10-08 LAB — URINE CULTURE

## 2019-10-08 MED ORDER — MAGNESIUM SULFATE 2 GM/50ML IV SOLN
2.0000 g | Freq: Once | INTRAVENOUS | Status: AC
  Administered 2019-10-08: 10:00:00 2 g via INTRAVENOUS
  Filled 2019-10-08: qty 50

## 2019-10-08 MED ORDER — K PHOS MONO-SOD PHOS DI & MONO 155-852-130 MG PO TABS
500.0000 mg | ORAL_TABLET | Freq: Two times a day (BID) | ORAL | Status: DC
  Administered 2019-10-08 – 2019-10-10 (×5): 500 mg via ORAL
  Filled 2019-10-08 (×6): qty 2

## 2019-10-08 NOTE — Progress Notes (Addendum)
PROGRESS NOTE  Olivia Sweeney [id number] DOB: 05-05-1966 DOA: 10/05/2019 PCP: Patient, No Pcp Per  Brief summary:  Olivia Sweeney is a 53 y.o. female was brought to the hospital after a friend was concerned that she was urinating on herself and had un-eaten food on the porch.  Patient has a h/o anorexia and heavy alcohol use.  She was found to have AKI, low K, low Mg.  HPI/Recap of past 24 hours:  Remain weak, she desires physical therapy  Assessment/Plan: Principal Problem:   ARF (acute renal failure) (HCC) Active Problems:   Diabetes (HCC)   Lynch syndrome   Abnormal liver function   Alcoholic ketoacidosis   UTI (urinary tract infection)   Hypokalemia   Gallstones   ETOH abuse   Generalized weakness  Acute renal failure on CKD 2 -Likely from dehydration, mild rhabdomyolysis and possible UTI -UA has positive nitrite, many bacteria, urine culture with multiple species, nonpredominant, blood culture no growth, no fever, no leukocytosis,  D/c Rocephin -Renal ultrasound "1. Normal sonographic appearance of the kidneys. 2. Distended urinary bladder without wall thickening." -bladder scan does not show any post void residual -Creatinine normalized, she is off IV fluids, encourage oral intake  Mild rhabdomyolysis -Likely due to prolonged immobilization at home -CK on presentation 613, trending down  Abnormal liver function. Likely related to etoh use. ast 117 and total bili 2.2 on admission CK mildly the elevated, trending down Hepatitis panel negative, HIV screening negative abdominal US with stones and sludge within gallbladder without gall bladder wall thickening  LFT normalized  Hypokalemia, hypomagnesemia, hypophosphatemia -Potassium improved, magnesium and phos remain low, continue replace , keep on telemetry for one more day  AG acidosissecondary to Alcoholic Ketoacidosis. CO2 12 with a gap of 25 on admission -Received hydration, resolved  Normocytic  anemia hgb appears at baseline about 10 -No overt sign of bleeding -We will add on occult blood testing and iron panel   Etoh use. Drink of choice vodka. Drinks  1-5 drinks a day  but not everyday. She does reports weird dreams , no overt s/sx withdrawal. Reports last drink 6 days ago.   -CIWA, folic acid and thiamine supplement  Impaired fasting blood glucose -Questionable history of diabetes, she is not on any medication prior to hospitalization -A1c 5.2 this time -Currently on SSI -Change diet to carb modified -Recommend follow-up with PCP  Generalized weakness/FTT. Patient reports this is why she came to hospital as "unable to walk". Chart review indicates she was urinating on self and not eating.  -she ambulated with physical therapy -nutritional consult -case manager  Reported history of weight loss, but report weight has been stable for the last 4 years  History of endometrial cancer status post hysterectomy  no history of chemo or radiation  H/o genetically proven Lynch Syndrome, last colonoscopy and EGD in 2018 She is followed by GI Dr. Owens Loffler   DVT Prophylaxis: Lovenox  Code Status: Full  Family Communication: patient   Disposition Plan: Home with home health in 1 to 2 days, pending lab work improvement and ambulation improvement   Consultants:  None  Procedures:  None  Antibiotics:  Rocephin from admission to 12/10   Objective: BP 140/84 (BP Location: Right Arm)   Pulse 77   Temp 98.9 F (37.2 C) (Oral)   Resp 17   Ht 5\' 4"  (1.626 m)   Wt 47.7 kg   LMP 02/16/2013   SpO2 100%   BMI 18.05 kg/m  No intake or output data in the 24 hours ending 10/08/19 0807 Filed Weights   10/06/19 1357 10/07/19 0539 10/08/19 0341  Weight: 49.1 kg 47.9 kg 47.7 kg    Exam: Patient is examined daily including today on 10/08/2019, exams remain the same as of yesterday except that has changed    General:  NAD  Cardiovascular: RRR  Respiratory:  CTABL  Abdomen: Soft/ND/NT, positive BS  Musculoskeletal: No Edema  Neuro: alert, oriented   Data Reviewed: Basic Metabolic Panel: Recent Labs  Lab 10/05/19 1525 10/05/19 2102 10/06/19 0500 10/06/19 1029 10/07/19 0557 10/08/19 0348  NA 133* 135 140  --  141 139  K 4.0 3.1* 3.0*  --  3.1* 3.8  CL 96*  --  104  --  109 102  CO2 12*  --  20*  --  24 27  GLUCOSE 122*  --  87  --  152* 165*  BUN 25*  --  18  --  15 15  CREATININE 1.76*  --  1.28*  --  1.06* 0.87  CALCIUM 9.3  --  8.3*  --  8.1* 8.4*  MG  --   --   --  1.2* 1.8 1.4*  PHOS  --   --   --   --  1.2* <1.0*   Liver Function Tests: Recent Labs  Lab 10/05/19 1525 10/06/19 0500 10/08/19 0348  AST 117* 63* 27  ALT 43 30 23  ALKPHOS 47 38 33*  BILITOT 2.2* 1.9* 0.7  PROT 7.1 5.3* 5.4*  ALBUMIN 3.9 2.8* 2.8*   No results for input(s): LIPASE, AMYLASE in the last 168 hours. No results for input(s): AMMONIA in the last 168 hours. CBC: Recent Labs  Lab 10/05/19 1525 10/05/19 2102 10/06/19 0500 10/08/19 0348  WBC 8.7  --  5.9 4.4  NEUTROABS 7.2  --   --  2.2  HGB 12.5 17.3* 10.3* 9.7*  HCT 38.4 51.0* 30.8* 29.2*  MCV 95.0  --  92.8 93.6  PLT 215  --  172 PLATELET CLUMPS NOTED ON SMEAR, UNABLE TO ESTIMATE   Cardiac Enzymes:   Recent Labs  Lab 10/05/19 1525 10/06/19 0500  CKTOTAL 613* 253*  CKMB 61.8* 24.4*   BNP (last 3 results) No results for input(s): BNP in the last 8760 hours.  ProBNP (last 3 results) No results for input(s): PROBNP in the last 8760 hours.  CBG: Recent Labs  Lab 10/07/19 0559 10/07/19 1100 10/07/19 1616 10/07/19 2250 10/08/19 0350  GLUCAP 150* 184* 230* 145* 152*    Recent Results (from the past 240 hour(s))  Culture, blood (routine x 2)     Status: None (Preliminary result)   Collection Time: 10/05/19  8:41 PM   Specimen: BLOOD RIGHT HAND  Result Value Ref Range Status   Specimen Description BLOOD RIGHT HAND  Final   Special Requests   Final    BOTTLES DRAWN  AEROBIC AND ANAEROBIC Blood Culture adequate volume   Culture   Final    NO GROWTH 2 DAYS Performed at [facility], [place] [address] [place], [place] [address]    Report Status PENDING  Incomplete  Culture, blood (routine x 2)     Status: None (Preliminary result)   Collection Time: 10/05/19  8:51 PM   Specimen: BLOOD LEFT WRIST  Result Value Ref Range Status   Specimen Description BLOOD LEFT WRIST  Final   Special Requests   Final    BOTTLES DRAWN AEROBIC AND ANAEROBIC Blood  Culture adequate volume   Culture   Final    NO GROWTH 2 DAYS Performed at [facility], [place] [address] [place], [place] [address]    Report Status PENDING  Incomplete  SARS CORONAVIRUS 2 (TAT 6-24 HRS) Nasopharyngeal Nasopharyngeal Swab     Status: None   Collection Time: 10/05/19 11:58 PM   Specimen: Nasopharyngeal Swab  Result Value Ref Range Status   SARS Coronavirus 2 NEGATIVE NEGATIVE Final    Comment: (NOTE) SARS-CoV-2 target nucleic acids are NOT DETECTED. The SARS-CoV-2 RNA is generally detectable in upper and lower respiratory specimens during the acute phase of infection. Negative results do not preclude SARS-CoV-2 infection, do not rule out co-infections with other pathogens, and should not be used as the sole basis for treatment or other patient management decisions. Negative results must be combined with clinical observations, patient history, and epidemiological information. The expected result is Negative. Fact Sheet for Patients: SugarRoll.be Fact Sheet for Healthcare Providers: https://www.woods-mathews.com/ This test is not yet approved or cleared by the [place] FDA and  has been authorized for detection and/or diagnosis of SARS-CoV-2 by FDA under an Emergency Use Authorization (EUA). This EUA will remain  in effect (meaning this test can be used) for the duration of the COVID-19 declaration under Section 56 4(b)(1) of the Act,  21 U.S.C. section 360bbb-3(b)(1), unless the authorization is terminated or revoked sooner. Performed at [facility], [place] [address] [place], [place] [address]   Culture, Urine     Status: Abnormal   Collection Time: 10/07/19  7:44 AM   Specimen: Urine, Random  Result Value Ref Range Status   Specimen Description URINE, RANDOM  Final   Special Requests   Final    NONE Performed at [facility], [place] [address], [place], [place] [address]    Culture MULTIPLE SPECIES PRESENT, SUGGEST RECOLLECTION (A)  Final   Report Status 10/08/2019 FINAL  Final     Studies: No results found.  Scheduled Meds: . enoxaparin (LOVENOX) injection  40 mg Subcutaneous Q24H  . feeding supplement  1 Container Oral TID BM  . folic acid  1 mg Oral Daily  . insulin aspart  0-5 Units Subcutaneous QHS  . insulin aspart  0-9 Units Subcutaneous TID WC  . LORazepam  0-4 mg Intravenous Q12H  . multivitamin with minerals  1 tablet Oral Daily  . phosphorus  500 mg Oral Once  . phosphorus  500 mg Oral BID  . thiamine  100 mg Oral Daily   Or  . thiamine  100 mg Intravenous Daily    Continuous Infusions: . cefTRIAXone (ROCEPHIN)  IV 1 g (10/07/19 1710)  . magnesium sulfate bolus IVPB       Time spent: 66mins I have personally reviewed and interpreted on  10/08/2019 daily labs, tele strips, imagings as discussed above under date review session and assessment and plans.  I reviewed all nursing notes, pharmacy notes,   vitals, pertinent old records  I have discussed plan of care as described above with RN , patient  on 10/08/2019   Florencia Reasons MD, PhD, FACP  [facility] If 7PM-7AM, please contact night-coverage at [email or web address], password [facility] 10/08/2019, 8:07 AM  LOS: 2 days

## 2019-10-09 DIAGNOSIS — F4323 Adjustment disorder with mixed anxiety and depressed mood: Secondary | ICD-10-CM | POA: Diagnosis present

## 2019-10-09 LAB — FERRITIN: Ferritin: 340 ng/mL — ABNORMAL HIGH (ref 11–307)

## 2019-10-09 LAB — BASIC METABOLIC PANEL
Anion gap: 10 (ref 5–15)
BUN: 16 mg/dL (ref 6–20)
CO2: 30 mmol/L (ref 22–32)
Calcium: 9 mg/dL (ref 8.9–10.3)
Chloride: 99 mmol/L (ref 98–111)
Creatinine, Ser: 1.04 mg/dL — ABNORMAL HIGH (ref 0.44–1.00)
GFR calc Af Amer: >60 mL/min (ref >60)
GFR calc non Af Amer: >60 mL/min (ref >60)
Glucose, Bld: 159 mg/dL — ABNORMAL HIGH (ref 70–99)
Potassium: 3.7 mmol/L (ref 3.5–5.1)
Sodium: 139 mmol/L (ref 135–145)

## 2019-10-09 LAB — MAGNESIUM: Magnesium: 1.6 mg/dL — ABNORMAL LOW (ref 1.7–2.4)

## 2019-10-09 LAB — IRON AND TIBC
Iron: 41 ug/dL (ref 28–170)
Saturation Ratios: 18 % (ref 10.4–31.8)
TIBC: 225 ug/dL — ABNORMAL LOW (ref 250–450)
UIBC: 184 ug/dL

## 2019-10-09 LAB — GLUCOSE, CAPILLARY
Glucose-Capillary: 144 mg/dL — ABNORMAL HIGH (ref 70–99)
Glucose-Capillary: 389 mg/dL — ABNORMAL HIGH (ref 70–99)
Glucose-Capillary: 125 mg/dL — ABNORMAL HIGH (ref 70–99)
Glucose-Capillary: 180 mg/dL — ABNORMAL HIGH (ref 70–99)

## 2019-10-09 LAB — PHOSPHORUS: Phosphorus: 3.1 mg/dL (ref 2.5–4.6)

## 2019-10-09 MED ORDER — FOLIC ACID 1 MG PO TABS: 1.0000 mg | ORAL_TABLET | Freq: Every day | ORAL | 0 refills | Status: AC

## 2019-10-09 MED ORDER — MAGNESIUM SULFATE 2 GM/50ML IV SOLN
2.0000 g | Freq: Once | INTRAVENOUS | Status: AC
  Administered 2019-10-09: 2 g via INTRAVENOUS
  Filled 2019-10-09: qty 50

## 2019-10-09 MED ORDER — MAGNESIUM OXIDE 400 MG PO CAPS: 400.0000 mg | ORAL_CAPSULE | Freq: Every day | ORAL | 0 refills | Status: AC

## 2019-10-09 MED ORDER — THIAMINE HCL 100 MG PO TABS: 100.0000 mg | ORAL_TABLET | Freq: Every day | ORAL | 0 refills | Status: AC

## 2019-10-09 MED FILL — FOLIC ACID 1 MG TABS: 1 | 30 days supply | Qty: 30 | Fill #0

## 2019-10-09 MED FILL — VITAMIN B-1 100 MG TABS: 100 | 30 days supply | Qty: 30 | Fill #0

## 2019-10-09 MED FILL — MAGNESIUM OXIDE 400 MG TABS: 400 | 30 days supply | Qty: 30 | Fill #0

## 2019-10-09 NOTE — Consult Note (Signed)
[facility] Face-to-Face Psychiatry Consult   Reason for Consult:  Depression  Referring Physician:  Dr Erlinda Hong Patient Identification: Olivia Sweeney MRN:  [id number] Principal Diagnosis: ARF (acute renal failure) ([place]) Diagnosis:  Principal Problem:   ARF (acute renal failure) ([place]) Active Problems:   Adjustment disorder with mixed anxiety and depressed mood   Diabetes ([place])   Lynch syndrome   Abnormal liver function   Alcoholic ketoacidosis   UTI (urinary tract infection)   Hypokalemia   Gallstones   ETOH abuse   Generalized weakness  Total Time spent with patient: 1 hour  Subjective:   Olivia Sweeney is a 53 y.o. female patient admitted with AKI.  Patient seen and evaluated in person by this provider.  She reports initially feeling "super fantastic."  She reports that her depression started 2 days ago when she thought she would be going to rehab and was told that she would not because she was too mobile.  Then her anxiety and depression started as she says she has nowhere to go.  Later in assessment however, she states that she has money to pay for therapy that is not an issue.  When asked if she could stay in a hotel warehouse this began renovated due to hoarding issues which will take approximately 2 weeks, she reports she could stay in a hotel but she does not want to be alone.  Her brother is coming in from [place] tomorrow and will be staying in hotel as he coordinates the cleaning of her house.  She denies suicidal thoughts, homicidal ideations, hallucinations, and recent substance abuse.  Her last drink was last Wednesday and she drinks occasionally 1-5 drinks.  She used to drink until she blacked out when her husband was alive 2 years ago but has not drank at this level since.  Denies any seizures when not drinking oral withdrawal symptoms.  She engaged easily in conversation and was calmly watching television prior to assessment.  She did become tearful at times which came and went.   She is not interested in starting any antidepressant but is willing to go to therapy.  She does not meet criteria for psychiatric hospitalization.  HPI per EDP:  Pt presents to the ED today with weakness.  Pt said she's gotten so weak that she's unable to walk around her house.  She said she just crawls.  The weakness has lasted about 4-5 days.  She has been urinating on herself.  Pt's friend said pt has not been eating food.  Pt said she has no appetite.  She denies f/c.  She denies known covid exposures.  She has no n/v/d or pain.    Past Psychiatric History: alcohol use disorder  Risk to Self:  none Risk to Others:  none Prior Inpatient Therapy:  none Prior Outpatient Therapy:  none  Past Medical History:  Past Medical History:  Diagnosis Date  . Anxiety    Very upset due to loss of ex- husband in Feb 2018  . Diabetes mellitus without complication (HCC)    diet controlled  . ETOH abuse   . Gallstones   . Lynch syndrome 2014  . Uterine cancer ([place]) 2014    Past Surgical History:  Procedure Laterality Date  . [place]   looking for fibroids  . ANTERIOR CERVICAL DECOMP/DISCECTOMY FUSION  03/06/2012   Procedure: ANTERIOR CERVICAL DECOMPRESSION/DISCECTOMY FUSION 1 LEVEL;  Surgeon: Sinclair Ship, MD;  Location: [facility];  Service: Orthopedics;  Laterality:  Left;  Anterior cervical decompression fusion, cervical 5-6, cervical 6-7 with instrumentation and allograft.  . cataracts    . dental implant     also had soft tissue graft after dental implant   . HYSTEROSCOPY WITH NOVASURE N/A 02/09/2013   Procedure: HYSTEROSCOPY WITH NOVASURE;  Surgeon: Olga Millers, MD;  Location: [facility];  Service: Gynecology;  Laterality: N/A;  . LYMPH NODE DISSECTION Bilateral 03/17/2013   Procedure: LYMPH NODE DISSECTION;  Surgeon: Imagene Gurney A. Alycia Rossetti, MD;  Location: [facility];  Service: Gynecology;  Laterality: Bilateral;  . ROBOTIC ASSISTED TOTAL HYSTERECTOMY WITH BILATERAL  SALPINGO OOPHERECTOMY N/A 03/17/2013   Procedure: ROBOTIC ASSISTED TOTAL HYSTERECTOMY WITH BILATERAL SALPINGO OOPHORECTOMY;  Surgeon: Imagene Gurney A. Alycia Rossetti, MD;  Location: [facility];  Service: Gynecology;  Laterality: N/A;   Family History:  Family History  Problem Relation Age of Onset  . Breast cancer Mother        dx in her late 40s-early 64s  . Uterine cancer Mother        dx in her 21s  . Kidney cancer Mother        dx in her 48s  . Ovarian cancer Maternal Aunt 48  . Uterine cancer Maternal Grandmother        dx in her 103s  . Uterine cancer Other    Family Psychiatric  History:  none Social History:  Social History   Substance and Sexual Activity  Alcohol Use Yes   Comment: 14-21 drinks in a week.     Social History   Substance and Sexual Activity  Drug Use No    Social History   Socioeconomic History  . Marital status: Divorced    Spouse name: Not on file  . Number of children: 0  . Years of education: Not on file  . Highest education level: Not on file  Occupational History  . Occupation: Therapist, sports: [employer]  Tobacco Use  . Smoking status: Never Smoker  . Smokeless tobacco: Never Used  Substance and Sexual Activity  . Alcohol use: Yes    Comment: 14-21 drinks in a week.  . Drug use: No  . Sexual activity: Not on file  Other Topics Concern  . Not on file  Social History Narrative  . Not on file   Social Determinants of Health   Financial Resource Strain:   . Difficulty of Paying Living Expenses: Not on file  Food Insecurity:   . Worried About Charity fundraiser in the Last Year: Not on file  . Ran Out of Food in the Last Year: Not on file  Transportation Needs:   . Lack of Transportation (Medical): Not on file  . Lack of Transportation (Non-Medical): Not on file  Physical Activity:   . Days of Exercise per Week: Not on file  . Minutes of Exercise per Session: Not on file  Stress:   . Feeling of Stress : Not on file  Social  Connections:   . Frequency of Communication with Friends and Family: Not on file  . Frequency of Social Gatherings with Friends and Family: Not on file  . Attends Religious Services: Not on file  . Active Member of Clubs or Organizations: Not on file  . Attends Archivist Meetings: Not on file  . Marital Status: Not on file   Additional Social History:    Allergies:   Allergies  Allergen Reactions  . Bee Venom Shortness Of Breath and Swelling  .  Other Other (See Comments)    Cats; Reaction: sneezing, etc    Labs:  Results for orders placed or performed during the hospital encounter of 10/05/19 (from the past 48 hour(s))  Glucose, capillary     Status: Abnormal   Collection Time: 10/07/19  4:16 PM  Result Value Ref Range   Glucose-Capillary 230 (H) 70 - 99 mg/dL   Comment 1 Notify RN   Glucose, capillary     Status: Abnormal   Collection Time: 10/07/19 10:50 PM  Result Value Ref Range   Glucose-Capillary 145 (H) 70 - 99 mg/dL  AM Labs bmp     Status: Abnormal   Collection Time: 10/08/19  3:48 AM  Result Value Ref Range   Sodium 139 135 - 145 mmol/L   Potassium 3.8 3.5 - 5.1 mmol/L   Chloride 102 98 - 111 mmol/L   CO2 27 22 - 32 mmol/L   Glucose, Bld 165 (H) 70 - 99 mg/dL   BUN 15 6 - 20 mg/dL   Creatinine, Ser 0.87 0.44 - 1.00 mg/dL   Calcium 8.4 (L) 8.9 - 10.3 mg/dL   GFR calc non Af Amer >60 >60 mL/min   GFR calc Af Amer >60 >60 mL/min   Anion gap 10 5 - 15    Comment: Performed at [facility], [place] [address] [place], [place] [address]  Magnesium     Status: Abnormal   Collection Time: 10/08/19  3:48 AM  Result Value Ref Range   Magnesium 1.4 (L) 1.7 - 2.4 mg/dL    Comment: Performed at [facility] [address], [place], [place] [address]  CBC with Differential/Platelet     Status: Abnormal   Collection Time: 10/08/19  3:48 AM  Result Value Ref Range   WBC 4.4 4.0 - 10.5 K/uL   RBC 3.12 (L) 3.87 - 5.11 MIL/uL   Hemoglobin 9.7 (L)  12.0 - 15.0 g/dL   HCT 29.2 (L) 36.0 - 46.0 %   MCV 93.6 80.0 - 100.0 fL   MCH 31.1 26.0 - 34.0 pg   MCHC 33.2 30.0 - 36.0 g/dL   RDW 14.5 11.5 - 15.5 %   Platelets PLATELET CLUMPS NOTED ON SMEAR, UNABLE TO ESTIMATE 150 - 400 K/uL    Comment: Immature Platelet Fraction may be clinically indicated, consider ordering this additional test [id number]    nRBC 0.0 0.0 - 0.2 %   Neutrophils Relative % 50 %   Neutro Abs 2.2 1.7 - 7.7 K/uL   Lymphocytes Relative 34 %   Lymphs Abs 1.5 0.7 - 4.0 K/uL   Monocytes Relative 12 %   Monocytes Absolute 0.5 0.1 - 1.0 K/uL   Eosinophils Relative 2 %   Eosinophils Absolute 0.1 0.0 - 0.5 K/uL   Basophils Relative 1 %   Basophils Absolute 0.0 0.0 - 0.1 K/uL   Immature Granulocytes 1 %   Abs Immature Granulocytes 0.05 0.00 - 0.07 K/uL    Comment: Performed at [facility] [address] [place], [place] [address]  Hepatic function panel     Status: Abnormal   Collection Time: 10/08/19  3:48 AM  Result Value Ref Range   Total Protein 5.4 (L) 6.5 - 8.1 g/dL   Albumin 2.8 (L) 3.5 - 5.0 g/dL   AST 27 15 - 41 U/L   ALT 23 0 - 44 U/L   Alkaline Phosphatase 33 (L) 38 - 126 U/L   Total Bilirubin 0.7 0.3 - 1.2  mg/dL   Bilirubin, Direct <0.1 0.0 - 0.2 mg/dL   Indirect Bilirubin NOT CALCULATED 0.3 - 0.9 mg/dL    Comment: Performed at [facility], [place] [address], [place], [place] [address]  Phosphorus     Status: Abnormal   Collection Time: 10/08/19  3:48 AM  Result Value Ref Range   Phosphorus <1.0 (LL) 2.5 - 4.6 mg/dL    Comment: CRITICAL RESULT CALLED TO, READ BACK BY AND VERIFIED WITH: RN E OFORI @0720  10/08/19 BY S GEZAHEGN Performed at [facility], [place] [address] [place], [place] [address]   Glucose, capillary     Status: Abnormal   Collection Time: 10/08/19  3:50 AM  Result Value Ref Range   Glucose-Capillary 152 (H) 70 - 99 mg/dL  Glucose, capillary     Status: Abnormal   Collection Time: 10/08/19 11:13 AM  Result Value  Ref Range   Glucose-Capillary 194 (H) 70 - 99 mg/dL  Glucose, capillary     Status: Abnormal   Collection Time: 10/08/19  9:16 PM  Result Value Ref Range   Glucose-Capillary 150 (H) 70 - 99 mg/dL  AM Labs bmp     Status: Abnormal   Collection Time: 10/09/19  5:52 AM  Result Value Ref Range   Sodium 139 135 - 145 mmol/L   Potassium 3.7 3.5 - 5.1 mmol/L   Chloride 99 98 - 111 mmol/L   CO2 30 22 - 32 mmol/L   Glucose, Bld 159 (H) 70 - 99 mg/dL   BUN 16 6 - 20 mg/dL   Creatinine, Ser 1.04 (H) 0.44 - 1.00 mg/dL   Calcium 9.0 8.9 - 10.3 mg/dL   GFR calc non Af Amer >60 >60 mL/min   GFR calc Af Amer >60 >60 mL/min   Anion gap 10 5 - 15    Comment: Performed at [facility], [place] [address], [place], [place] [address]  Magnesium     Status: Abnormal   Collection Time: 10/09/19  5:52 AM  Result Value Ref Range   Magnesium 1.6 (L) 1.7 - 2.4 mg/dL    Comment: Performed at [facility] [address], [place], [place] [address]  Iron and TIBC     Status: Abnormal   Collection Time: 10/09/19  5:52 AM  Result Value Ref Range   Iron 41 28 - 170 ug/dL   TIBC 225 (L) 250 - 450 ug/dL   Saturation Ratios 18 10.4 - 31.8 %   UIBC 184 ug/dL    Comment: Performed at [facility], [place] [address], [place], [place] [address]  Ferritin     Status: Abnormal   Collection Time: 10/09/19  5:52 AM  Result Value Ref Range   Ferritin 340 (H) 11 - 307 ng/mL    Comment: Performed at [facility], [place] [address] [place], [place] [address]  Phosphorus     Status: None   Collection Time: 10/09/19  5:52 AM  Result Value Ref Range   Phosphorus 3.1 2.5 - 4.6 mg/dL    Comment: Performed at [facility] [address] [place], [place] [address]  Glucose, capillary     Status: Abnormal   Collection Time: 10/09/19  5:57 AM  Result Value Ref Range   Glucose-Capillary 144 (H) 70 - 99 mg/dL  Glucose, capillary     Status: Abnormal   Collection Time: 10/09/19 11:09 AM  Result Value  Ref Range   Glucose-Capillary 389 (H)  70 - 99 mg/dL   Comment 1 Notify RN     Current Facility-Administered Medications  Medication Dose Route Frequency Provider Last Rate Last Admin  . enoxaparin (LOVENOX) injection 40 mg  40 mg Subcutaneous Q24H Jani Gravel, MD   40 mg at 10/08/19 2221  . feeding supplement (BOOST / RESOURCE BREEZE) liquid 1 Container  1 Container Oral TID BM Vann, Jessica U, DO   1 Container at 10/09/19 1016  . folic acid (FOLVITE) tablet 1 mg  1 mg Oral Daily Jani Gravel, MD   1 mg at 10/09/19 0825  . insulin aspart (novoLOG) injection 0-5 Units  0-5 Units Subcutaneous QHS Jani Gravel, MD      . insulin aspart (novoLOG) injection 0-9 Units  0-9 Units Subcutaneous TID WC Jani Gravel, MD   9 Units at 10/09/19 1146  . LORazepam (ATIVAN) injection 0-4 mg  0-4 mg Intravenous Marjean Donna, MD      . multivitamin with minerals tablet 1 tablet  1 tablet Oral Daily Jani Gravel, MD   1 tablet at 10/08/19 [phone]  . phosphorus (K PHOS NEUTRAL) tablet 500 mg  500 mg Oral BID Florencia Reasons, MD   500 mg at 10/09/19 0844  . thiamine (VITAMIN B-1) tablet 100 mg  100 mg Oral Daily Jani Gravel, MD   100 mg at 10/09/19 0825   Or  . thiamine (B-1) injection 100 mg  100 mg Intravenous Daily Jani Gravel, MD        Musculoskeletal: Strength & Muscle Tone: decreased Gait & Station: did not witness Patient leans: N/A  Psychiatric Specialty Exam: Physical Exam  Nursing note and vitals reviewed. Constitutional: She is oriented to person, place, and time. She appears well-developed and well-nourished.  HENT:  Head: Normocephalic.  Respiratory: Effort normal.  Musculoskeletal:        General: Normal range of motion.     Cervical back: Normal range of motion.  Neurological: She is alert and oriented to person, place, and time.  Psychiatric: Her speech is normal and behavior is normal. Judgment and thought content normal. Her mood appears anxious. Cognition and memory are normal. She exhibits a  depressed mood.    Review of Systems  Psychiatric/Behavioral: Positive for dysphoric mood. The patient is nervous/anxious.   All other systems reviewed and are negative.   Blood pressure 103/64, pulse 89, temperature 98.6 F (37 C), temperature source Oral, resp. rate 18, height 5\' 4"  (1.626 m), weight 46.5 kg, last menstrual period 02/16/2013, SpO2 99 %.Body mass index is 17.6 kg/m.  General Appearance: Casual  Eye Contact:  Good  Speech:  Normal Rate  Volume:  Normal  Mood:  Anxious and Depressed  Affect:  Congruent  Thought Process:  Coherent and Descriptions of Associations: Intact  Orientation:  Full (Time, Place, and Person)  Thought Content:  WDL and Logical  Suicidal Thoughts:  No  Homicidal Thoughts:  No  Memory:  Immediate;   Good Recent;   Good Remote;   Good  Judgement:  Good  Insight:  Fair  Psychomotor Activity:  Normal  Concentration:  Concentration: Good and Attention Span: Good  Recall:  Good  Fund of Knowledge:  Good  Language:  Good  Akathisia:  No  Handed:  Right  AIMS (if indicated):     Assets:  Housing Leisure Time Resilience Social Support  ADL's:  Intact  Cognition:  WNL  Sleep:      53 year old female admitted with kidney issues and consult placed for  depression and alcohol use disorder.  Denies history of depression or other psychiatric issues except alcohol abuse.  She reports she used to drink until she blacked out when her husband was alive.  Since his death 2 years ago she reports occasionally drinking 1-5 drinks.  Denies blacking out, seizures or withdrawal symptoms.  No other drug or substance use.  No suicidal/homicidal ideations, hallucinations.  Patient is currently stable from a psychiatric perspective.  Treatment Plan Summary:  Adjustment disorder with mixed anxiety and depressed mood -Recommend [facility], resources placed in discharge instructions  Disposition: No evidence of imminent risk to self or others at present.    Waylan Boga, NP 10/09/2019 2:08 PM

## 2019-10-09 NOTE — Progress Notes (Signed)
PROGRESS NOTE  Olivia Sweeney [id number] DOB: 12-03-1965 DOA: 10/05/2019 PCP: Patient, No Pcp Per  Brief summary:  Olivia Sweeney is a 53 y.o. female was brought to the hospital after a friend was concerned that she was urinating on herself and had un-eaten food on the porch.  Patient has a h/o anorexia and heavy alcohol use.  She was found to have AKI, low K, low Mg.  HPI/Recap of past 24 hours:   feeling weak, she desires physical therapy She is happy that her friend sent flower to her .  Assessment/Plan: Principal Problem:   ARF (acute renal failure) (HCC) Active Problems:   Diabetes (HCC)   Lynch syndrome   Abnormal liver function   Alcoholic ketoacidosis   UTI (urinary tract infection)   Hypokalemia   Gallstones   ETOH abuse   Generalized weakness  Acute renal failure on CKD 2 -Likely from dehydration, mild rhabdomyolysis and possible UTI -UA has positive nitrite, many bacteria, urine culture with multiple species, nonpredominant, blood culture no growth, no fever, no leukocytosis,  D/c Rocephin -Renal ultrasound "1. Normal sonographic appearance of the kidneys. 2. Distended urinary bladder without wall thickening." -bladder scan does not show any post void residual -Creatinine normalized, she is off IV fluids, encourage oral intake  Mild rhabdomyolysis -Likely due to prolonged immobilization at home -CK on presentation 613, trending down  Abnormal liver function. Likely related to etoh use. ast 117 and total bili 2.2 on admission CK mildly the elevated, trending down Hepatitis panel negative, HIV screening negative abdominal US with stones and sludge within gallbladder without gall bladder wall thickening  LFT normalized  Hypokalemia, hypomagnesemia, hypophosphatemia -Potassium and phos nromalised, magnesium  remains low, continue replace , -Overall improving ,D/c tele  telemetry   AG acidosissecondary to Alcoholic Ketoacidosis. CO2 12 with a gap of  25 on admission -Received hydration, resolved  Normocytic anemia hgb appears at baseline about 10 -No overt sign of bleeding -We will add on occult blood testing and iron panel   Etoh use. Drink of choice vodka. Drinks  1-5 drinks a day  but not everyday. She does reports weird dreams , no overt s/sx withdrawal. Reports last drink 6 days ago.   -CIWA, folic acid and thiamine supplement  Impaired fasting blood glucose -Questionable history of diabetes, she is not on any medication prior to hospitalization -A1c 5.2 this time -Currently on SSI -Change diet to carb modified -Recommend follow-up with PCP  Generalized weakness/FTT. Patient reports this is why she came to hospital as "unable to walk". Chart review indicates she was urinating on self and not eating.  -she ambulated with physical therapy -nutritional consult -case manager  Reported history of weight loss, but report weight has been stable for the last 4 years  History of endometrial cancer status post hysterectomy  no history of chemo or radiation  H/o genetically proven Lynch Syndrome, last colonoscopy and EGD in 2018 She is followed by GI Dr. Owens Loffler  Depressed mood,  Psychiatry consult requested   DVT Prophylaxis: Lovenox  Code Status: Full  Family Communication: patient   Disposition Plan: Home with home health in 1 to 2 days, pending lab work improvement and ambulation improvement Patient plans to go to a hotel before going back home, Education officer, museum and case manager input appreciated Awaiting psych consult   Consultants:  Physical therapist  Social Development worker, community  psychiatry  Procedures:  None  Antibiotics:  Rocephin from admission to 12/10   Objective:  BP 136/85 (BP Location: Right Arm)   Pulse 78   Temp 99.2 F (37.3 C) (Oral)   Resp 16   Ht 5\' 4"  (1.626 m)   Wt 46.5 kg   LMP 02/16/2013   SpO2 99%   BMI 17.60 kg/m   Intake/Output Summary (Last 24 hours) at 10/09/2019  1055 Last data filed at 10/09/2019 0400 Gross per 24 hour  Intake 240 ml  Output --  Net 240 ml   Filed Weights   10/07/19 0539 10/08/19 0341 10/09/19 0534  Weight: 47.9 kg 47.7 kg 46.5 kg    Exam: Patient is examined daily including today on 10/09/2019, exams remain the same as of yesterday except that has changed    General:  NAD, labile mood  Cardiovascular: RRR  Respiratory: CTABL  Abdomen: Soft/ND/NT, positive BS  Musculoskeletal: No Edema  Neuro: alert, oriented   Data Reviewed: Basic Metabolic Panel: Recent Labs  Lab 10/05/19 1525 10/05/19 2102 10/06/19 0500 10/06/19 1029 10/07/19 0557 10/08/19 0348 10/09/19 0552  NA 133* 135 140  --  141 139 139  K 4.0 3.1* 3.0*  --  3.1* 3.8 3.7  CL 96*  --  104  --  109 102 99  CO2 12*  --  20*  --  24 27 30   GLUCOSE 122*  --  87  --  152* 165* 159*  BUN 25*  --  18  --  15 15 16   CREATININE 1.76*  --  1.28*  --  1.06* 0.87 1.04*  CALCIUM 9.3  --  8.3*  --  8.1* 8.4* 9.0  MG  --   --   --  1.2* 1.8 1.4* 1.6*  PHOS  --   --   --   --  1.2* <1.0* 3.1   Liver Function Tests: Recent Labs  Lab 10/05/19 1525 10/06/19 0500 10/08/19 0348  AST 117* 63* 27  ALT 43 30 23  ALKPHOS 47 38 33*  BILITOT 2.2* 1.9* 0.7  PROT 7.1 5.3* 5.4*  ALBUMIN 3.9 2.8* 2.8*   No results for input(s): LIPASE, AMYLASE in the last 168 hours. No results for input(s): AMMONIA in the last 168 hours. CBC: Recent Labs  Lab 10/05/19 1525 10/05/19 2102 10/06/19 0500 10/08/19 0348  WBC 8.7  --  5.9 4.4  NEUTROABS 7.2  --   --  2.2  HGB 12.5 17.3* 10.3* 9.7*  HCT 38.4 51.0* 30.8* 29.2*  MCV 95.0  --  92.8 93.6  PLT 215  --  172 PLATELET CLUMPS NOTED ON SMEAR, UNABLE TO ESTIMATE   Cardiac Enzymes:   Recent Labs  Lab 10/05/19 1525 10/06/19 0500  CKTOTAL 613* 253*  CKMB 61.8* 24.4*   BNP (last 3 results) No results for input(s): BNP in the last 8760 hours.  ProBNP (last 3 results) No results for input(s): PROBNP in the last 8760  hours.  CBG: Recent Labs  Lab 10/07/19 2250 10/08/19 0350 10/08/19 1113 10/08/19 2116 10/09/19 0557  GLUCAP 145* 152* 194* 150* 144*    Recent Results (from the past 240 hour(s))  Culture, blood (routine x 2)     Status: None (Preliminary result)   Collection Time: 10/05/19  8:41 PM   Specimen: BLOOD RIGHT HAND  Result Value Ref Range Status   Specimen Description BLOOD RIGHT HAND  Final   Special Requests   Final    BOTTLES DRAWN AEROBIC AND ANAEROBIC Blood Culture adequate volume   Culture   Final    NO GROWTH  3 DAYS Performed at [facility], [place] [address], [place], [place] [address]    Report Status PENDING  Incomplete  Culture, blood (routine x 2)     Status: None (Preliminary result)   Collection Time: 10/05/19  8:51 PM   Specimen: BLOOD LEFT WRIST  Result Value Ref Range Status   Specimen Description BLOOD LEFT WRIST  Final   Special Requests   Final    BOTTLES DRAWN AEROBIC AND ANAEROBIC Blood Culture adequate volume   Culture   Final    NO GROWTH 3 DAYS Performed at [facility], [place] [address] [place], [place] [address]    Report Status PENDING  Incomplete  SARS CORONAVIRUS 2 (TAT 6-24 HRS) Nasopharyngeal Nasopharyngeal Swab     Status: None   Collection Time: 10/05/19 11:58 PM   Specimen: Nasopharyngeal Swab  Result Value Ref Range Status   SARS Coronavirus 2 NEGATIVE NEGATIVE Final    Comment: (NOTE) SARS-CoV-2 target nucleic acids are NOT DETECTED. The SARS-CoV-2 RNA is generally detectable in upper and lower respiratory specimens during the acute phase of infection. Negative results do not preclude SARS-CoV-2 infection, do not rule out co-infections with other pathogens, and should not be used as the sole basis for treatment or other patient management decisions. Negative results must be combined with clinical observations, patient history, and epidemiological information. The expected result is Negative. Fact Sheet for  Patients: SugarRoll.be Fact Sheet for Healthcare Providers: https://www.woods-mathews.com/ This test is not yet approved or cleared by the [place] FDA and  has been authorized for detection and/or diagnosis of SARS-CoV-2 by FDA under an Emergency Use Authorization (EUA). This EUA will remain  in effect (meaning this test can be used) for the duration of the COVID-19 declaration under Section 56 4(b)(1) of the Act, 21 U.S.C. section 360bbb-3(b)(1), unless the authorization is terminated or revoked sooner. Performed at [facility], [place] [address], [place], [place] [address]   Culture, Urine     Status: Abnormal   Collection Time: 10/07/19  7:44 AM   Specimen: Urine, Random  Result Value Ref Range Status   Specimen Description URINE, RANDOM  Final   Special Requests   Final    NONE Performed at [facility], [place] [address] [place],  [address]    Culture MULTIPLE SPECIES PRESENT, SUGGEST RECOLLECTION (A)  Final   Report Status 10/08/2019 FINAL  Final     Studies: No results found.  Scheduled Meds: . enoxaparin (LOVENOX) injection  40 mg Subcutaneous Q24H  . feeding supplement  1 Container Oral TID BM  . folic acid  1 mg Oral Daily  . insulin aspart  0-5 Units Subcutaneous QHS  . insulin aspart  0-9 Units Subcutaneous TID WC  . LORazepam  0-4 mg Intravenous Q12H  . multivitamin with minerals  1 tablet Oral Daily  . phosphorus  500 mg Oral BID  . thiamine  100 mg Oral Daily   Or  . thiamine  100 mg Intravenous Daily    Continuous Infusions:    Time spent: 69mins I have personally reviewed and interpreted on  10/09/2019 daily labs, tele strips, imagings as discussed above under date review session and assessment and plans.  I reviewed all nursing notes, pharmacy notes,   vitals, pertinent old records  I have discussed plan of care as described above with RN , patient  on 10/09/2019   Florencia Reasons MD, PhD,  FACP  [facility] If 7PM-7AM,  please contact night-coverage at [email or web address], password [facility] 10/09/2019, 10:55 AM  LOS: 3 days

## 2019-10-09 NOTE — TOC Progression Note (Signed)
Transition of Care [facility]) - Progression Note    Patient Details  Name: Olivia Sweeney MRN: [id number] Date of Birth: May 09, 1966  Transition of [facility]) CM/SW Contact  Jacalyn Lefevre Edson Snowball, RN Phone Number: 10/09/2019, 11:02 AM  Clinical Narrative:     Spoke to patient at bedside. Walker for home has been delivered. Awaiting psych consult and their recommendations.   Expected Discharge Plan: Home/Self Care Barriers to Discharge: Continued Medical Work up  Expected Discharge Plan and Services Expected Discharge Plan: Home/Self Care In-house Referral: Clinical Social Work Discharge Planning Services: CM Consult Post Acute Care Choice: Durable Medical Equipment Living arrangements for the past 2 months: Single Family Home                 DME Arranged: Walker rolling DME Agency: [employer]     Representative spoke with at DME Agency: [place]: Refused [place]           Social Determinants of Health ([place]) Interventions    Readmission Risk Interventions Readmission Risk Prevention Plan 10/07/2019  Transportation Screening Complete  PCP or Specialist Appt within 5-7 Days Complete  Home Care Screening Complete  Medication Review (RN CM) Referral to Pharmacy  Some recent data might be hidden

## 2019-10-09 NOTE — Progress Notes (Signed)
Physical Therapy Treatment Patient Details Name: Olivia Sweeney MRN: [id number] DOB: 1966/08/06 Today's Date: 10/09/2019    History of Present Illness 53 year old who was admitted due to weakness and not feeling well. In further notes it is found that a friend had her come due to that she was not eating and was urinating on herself. PMH to include: ETOH use, uterine Ca, Lynch syndrome.    PT Comments    Patient progressing with mobility including more confident with ambulation with RW, increased stride length, improved stability.  Also with increased activity tolerance, though did report fatigue and very hot with increased ambulation.  VSS seated after ambulation.  Feel she continues to need follow up HHPT/aide/SW follow up.  She admits she needs her family to help get her home in shape for her to live there.  May be able to get help for hotel room until home is set up.  Also feel psychiatric consult may help for management of stressors, emotional issues.  PT to follow acutely.   Follow Up Recommendations  Home health PT(HH aide/SW follow up)     Equipment Recommendations  Rolling walker with 5" wheels;3in1 (PT)    Recommendations for Other Services       Precautions / Restrictions Precautions Precautions: Fall    Mobility  Bed Mobility Overal bed mobility: Modified Independent             General bed mobility comments: no assist for bed mobility, but does use rails to help her  Transfers Overall transfer level: Modified independent Equipment used: Rolling walker (2 wheeled) Transfers: Sit to/from Stand           General transfer comment: performing transfers even more safely than last session telling herself not to plop when she sits  Ambulation/Gait Ambulation/Gait assistance: Supervision Gait Distance (Feet): 300 Feet(x 2) Assistive device: Rolling walker (2 wheeled) Gait Pattern/deviations: Step-through pattern;Decreased stride length     General Gait  Details: safe with RW, no LOB, S due to fatigue with increased distance   Stairs             Wheelchair Mobility    Modified Rankin (Stroke Patients Only)       Balance Overall balance assessment: Needs assistance   Sitting balance-Leahy Scale: Normal       Standing balance-Leahy Scale: Fair Standing balance comment: UE support for ambulation                            Cognition Arousal/Alertness: Awake/alert   Overall Cognitive Status: Within Functional Limits for tasks assessed                                 General Comments: tearful during my session talking about her history of hoarding, has admitted to her family she needs help and has set up for her brother to come and clean out her home, her friend is taking her dogs to board at the vet and she feels happy she has not had a drink in 10 days and hasn't had withdrawl symptoms or cravings.      Exercises Other Exercises Other Exercises: supine bridging x 5, SLR x 5, and sidelying clamshell hip abduction x 5    General Comments General comments (skin integrity, edema, etc.): Communicated with SW about possible needs for psychiatric consult and pt's thoughts about possibly going to hotel  temporarily till her brother can help get her home in better shapt      Pertinent Vitals/Pain Pain Assessment: No/denies pain    Home Living                      Prior Function            PT Goals (current goals can now be found in the care plan section) Progress towards PT goals: Progressing toward goals    Frequency    Min 3X/week      PT Plan      Co-evaluation              AM-PAC PT "6 Clicks" Mobility   Outcome Measure  Help needed turning from your back to your side while in a flat bed without using bedrails?: None Help needed moving from lying on your back to sitting on the side of a flat bed without using bedrails?: None Help needed moving to and from a bed  to a chair (including a wheelchair)?: None Help needed standing up from a chair using your arms (e.g., wheelchair or bedside chair)?: None Help needed to walk in hospital room?: A Little Help needed climbing 3-5 steps with a railing? : A Little 6 Click Score: 22    End of Session   Activity Tolerance: Patient tolerated treatment well Patient left: in bed;with call bell/phone within reach;with bed alarm set   PT Visit Diagnosis: Muscle weakness (generalized) (M62.81)     Time: [id number] PT Time Calculation (min) (ACUTE ONLY): 45 min  Charges:  $Gait Training: 23-37 mins $Self Care/Home Management: [place], [place] [phone] 10/09/2019    Reginia Naas 10/09/2019, 10:36 AM

## 2019-10-10 DIAGNOSIS — F5089 Other specified eating disorder: Secondary | ICD-10-CM

## 2019-10-10 DIAGNOSIS — F329 Major depressive disorder, single episode, unspecified: Secondary | ICD-10-CM

## 2019-10-10 DIAGNOSIS — E860 Dehydration: Secondary | ICD-10-CM

## 2019-10-10 DIAGNOSIS — F419 Anxiety disorder, unspecified: Secondary | ICD-10-CM

## 2019-10-10 DIAGNOSIS — F1011 Alcohol abuse, in remission: Secondary | ICD-10-CM

## 2019-10-10 LAB — BASIC METABOLIC PANEL
Anion gap: 11 (ref 5–15)
BUN: 24 mg/dL — ABNORMAL HIGH (ref 6–20)
CO2: 30 mmol/L (ref 22–32)
Calcium: 8.8 mg/dL — ABNORMAL LOW (ref 8.9–10.3)
Chloride: 98 mmol/L (ref 98–111)
Creatinine, Ser: 1.09 mg/dL — ABNORMAL HIGH (ref 0.44–1.00)
GFR calc Af Amer: >60 mL/min (ref >60)
GFR calc non Af Amer: 57 mL/min — ABNORMAL LOW (ref >60)
Glucose, Bld: 292 mg/dL — ABNORMAL HIGH (ref 70–99)
Potassium: 3.8 mmol/L (ref 3.5–5.1)
Sodium: 139 mmol/L (ref 135–145)

## 2019-10-10 LAB — CBC WITH DIFFERENTIAL/PLATELET
Abs Immature Granulocytes: 0.03 10*3/uL (ref 0.00–0.07)
Basophils Absolute: 0 10*3/uL (ref 0.0–0.1)
Basophils Relative: 1 %
Eosinophils Absolute: 0.1 10*3/uL (ref 0.0–0.5)
Eosinophils Relative: 3 %
HCT: 30.7 % — ABNORMAL LOW (ref 36.0–46.0)
Hemoglobin: 10.2 g/dL — ABNORMAL LOW (ref 12.0–15.0)
Immature Granulocytes: 1 %
Lymphocytes Relative: 38 %
Lymphs Abs: 1.4 10*3/uL (ref 0.7–4.0)
MCH: 30.7 pg (ref 26.0–34.0)
MCHC: 33.2 g/dL (ref 30.0–36.0)
MCV: 92.5 fL (ref 80.0–100.0)
Monocytes Absolute: 0.7 10*3/uL (ref 0.1–1.0)
Monocytes Relative: 18 %
Neutro Abs: 1.5 10*3/uL — ABNORMAL LOW (ref 1.7–7.7)
Neutrophils Relative %: 39 %
Platelets: 141 10*3/uL — ABNORMAL LOW (ref 150–400)
RBC: 3.32 MIL/uL — ABNORMAL LOW (ref 3.87–5.11)
RDW: 14.7 % (ref 11.5–15.5)
WBC: 3.7 10*3/uL — ABNORMAL LOW (ref 4.0–10.5)
nRBC: 0 % (ref 0.0–0.2)

## 2019-10-10 LAB — CULTURE, BLOOD (ROUTINE X 2)
Culture: NO GROWTH
Culture: NO GROWTH
Special Requests: ADEQUATE
Special Requests: ADEQUATE

## 2019-10-10 LAB — GLUCOSE, CAPILLARY
Glucose-Capillary: 243 mg/dL — ABNORMAL HIGH (ref 70–99)
Glucose-Capillary: 172 mg/dL — ABNORMAL HIGH (ref 70–99)
Glucose-Capillary: 259 mg/dL — ABNORMAL HIGH (ref 70–99)
Glucose-Capillary: 204 mg/dL — ABNORMAL HIGH (ref 70–99)

## 2019-10-10 LAB — PHOSPHORUS: Phosphorus: 4 mg/dL (ref 2.5–4.6)

## 2019-10-10 LAB — HEMOGLOBIN A1C
Hgb A1c MFr Bld: 5.7 % — ABNORMAL HIGH (ref 4.8–5.6)
Mean Plasma Glucose: 116.89 mg/dL

## 2019-10-10 LAB — MAGNESIUM: Magnesium: 1.7 mg/dL (ref 1.7–2.4)

## 2019-10-10 MED ORDER — SODIUM CHLORIDE 0.9 % IV SOLN
INTRAVENOUS | Status: AC
  Administered 2019-10-10 (×2): via INTRAVENOUS

## 2019-10-10 MED ORDER — GLUCERNA SHAKE PO LIQD
237.0000 mL | Freq: Three times a day (TID) | ORAL | Status: DC
  Administered 2019-10-10: 237 mL via ORAL
  Filled 2019-10-10: qty 237

## 2019-10-10 MED ORDER — FLUOXETINE HCL 20 MG PO CAPS
20.0000 mg | ORAL_CAPSULE | Freq: Every day | ORAL | Status: DC
  Filled 2019-10-10: qty 1

## 2019-10-10 NOTE — TOC Progression Note (Signed)
Transition of Care [facility]) - Progression Note    Patient Details  Name: Olivia Sweeney MRN: [id number] Date of Birth: September 01, 1966  Transition of Care [facility]) CM/SW [place], LCSW Phone Number: 10/10/2019, 5:14 PM  Clinical Narrative:  CSW met with patient to discuss substance use resources. Patient presented as tearful throughout the discussion. Patient reported she has recently come to terms with some things and shared she has been drinking daily and has had episodes of blacking out and has been hoarding into her home until the point where she has to crawl around to get around her house. Patient reported she was feeling anxious over her family that were currently working on cleaning her home and her close friend who has been trying to find support for her.  Patient reported she had seen psychiatry earlier and had been cleared as she has no current SI/HI or other risk factors suggesting eminent risk. Patient requested she wanted information for mental health and not substance use. CSW spoke with patient and noted she denied having any outpatient mental health supports. CSW discussed with patient different options and received consent for an outpatient referral to be made.  Patient asked CSW if there were self-pay options for mental health residential treatment. CSW informed patient he is not prepared with that information and discussed some providers she could self-present to for inpatient admission although [facility] is recommending outpatient. Patient reported she wanted some sort of residential treatment like on T.V. where she could have the support to talk to people as she needs but also have the space to process her emotions in peace. CSW informed patient he will need to do some community research and will provide additional resources regarding therapeutic residential treatment options for adults.     Expected Discharge Plan: Home/Self Care Barriers to Discharge: Continued Medical  Work up  Expected Discharge Plan and Services Expected Discharge Plan: Home/Self Care In-house Referral: Clinical Social Work Discharge Planning Services: CM Consult Post Acute Care Choice: Durable Medical Equipment Living arrangements for the past 2 months: Single Family Home                 DME Arranged: Walker rolling DME Agency: [employer]     Representative spoke with at DME Agency: [place]: Refused [place]           Social Determinants of Health ([place]) Interventions    Readmission Risk Interventions Readmission Risk Prevention Plan 10/07/2019  Transportation Screening Complete  PCP or Specialist Appt within 5-7 Days Complete  Home Care Screening Complete  Medication Review (RN CM) Referral to Pharmacy  Some recent data might be hidden

## 2019-10-10 NOTE — Progress Notes (Addendum)
Patient set up with Ipad, connected to psychiatry for follow-up evaluation.  Pt provided privacy for the session.

## 2019-10-10 NOTE — Progress Notes (Signed)
   Vital Signs MEWS/VS Documentation       10/09/2019 2051 10/09/2019 2351 10/10/2019 0544 10/10/2019 0734   MEWS Score:  0  0  0  0   MEWS Score Color:  Green  Green  Green  Green   Resp:  --  16  15  --   Pulse:  --  85  79  --   BP:  --  115/71  131/82  --   Temp:  --  98.7 F (37.1 C)  99 F (37.2 C)  --   O2 Device:  --  Room [employer]  --   Level of Consciousness:  Alert  --  --  Alert            [employer] Tobias-Diakun 10/10/2019,9:31 AM

## 2019-10-10 NOTE — Progress Notes (Signed)
Patient resting comfortably during shift report. Denies complaints.  

## 2019-10-10 NOTE — Progress Notes (Signed)
   Vital Signs MEWS/VS Documentation       10/09/2019 2051 10/09/2019 2351 10/10/2019 0544 10/10/2019 0734   MEWS Score:  0  0  0  0   MEWS Score Color:  Green  Green  Green  Green   Resp:  --  16  15  --   Pulse:  --  85  79  --   BP:  --  115/71  131/82  --   Temp:  --  98.7 F (37.1 C)  99 F (37.2 C)  --   O2 Device:  --  Room [employer]  --   Level of Consciousness:  Alert  --  --  Alert            [employer] Tobias-Diakun 10/10/2019,7:37 AM

## 2019-10-10 NOTE — Progress Notes (Signed)
PROGRESS NOTE  Olivia Sweeney [id number] DOB: [birth year] DOA: 10/05/2019 PCP: Patient, No Pcp Per  Brief summary:  Olivia Sweeney is a 53 y.o. female was brought to the hospital after a friend was concerned that she was urinating on herself and had un-eaten food on the porch.  Patient has a h/o anorexia and heavy alcohol use.  She was found to have AKI, low K, low Mg.  HPI/Recap of past 24 hours:  " I am mentally exhausted " she is crying, she desires to talk to psychiatrist again today,  Blood glucose elevated, poor oral intake  Assessment/Plan: Principal Problem:   ARF (acute renal failure) (HCC) Active Problems:   Diabetes ([place])   Lynch syndrome   Abnormal liver function   Alcoholic ketoacidosis   UTI (urinary tract infection)   Hypokalemia   Gallstones   ETOH abuse   Generalized weakness   Adjustment disorder with mixed anxiety and depressed mood  Acute renal failure on CKD 2 -Likely from dehydration, mild rhabdomyolysis and possible UTI -UA has positive nitrite, many bacteria, urine culture with multiple species, nonpredominant, blood culture no growth, no fever, no leukocytosis,  D/c Rocephin -Renal ultrasound "1. Normal sonographic appearance of the kidneys. 2. Distended urinary bladder without wall thickening." -bladder scan does not show any post void residual -Creatinine improved, oral intake remain poor, will give another day of iv hydration,   Mild rhabdomyolysis -Likely due to prolonged immobilization at home -CK on presentation 613, trending down  Abnormal liver function. Likely related to etoh use. ast 117 and total bili 2.2 on admission CK mildly the elevated, trending down Hepatitis panel negative, HIV screening negative abdominal US with stones and sludge within gallbladder without gall bladder wall thickening  LFT normalized  Hypokalemia, hypomagnesemia, hypophosphatemia --Overall improving ,D/c tele  telemetry  -Replace as needed  AG  acidosissecondary to Alcoholic Ketoacidosis. CO2 12 with a gap of 25 on admission -Received hydration, resolved  Normocytic anemia, possible anemia of chronic disease from chronic alcohol use hgb appears at baseline about 10 -No overt sign of bleeding, reports stool is brown  - iron panel is not consistent with iron deficiency  Etoh use. Drink of choice vodka. Drinks  1-5 drinks a day  but not everyday. She does reports weird dreams , no overt s/sx withdrawal. Reports last drink 6 days ago.   -CIWA, folic acid and thiamine supplement  Impaired fasting blood glucose -Questionable history of diabetes, she is not on any medication prior to hospitalization -A1c 5.2 this time -Currently on SSI -Change diet to carb modified, d/c boost, change to glucerna, she prefers diet control, she does not want to start any medication for this -Recommend follow-up with PCP  Generalized weakness/FTT. Patient reports this is why she came to hospital as "unable to walk". Chart review indicates she was urinating on self and not eating.  -she ambulated with physical therapy, PT recommended home health -nutritional consult -case manager  Reported history of weight loss, but report weight has been stable for the last 4 years  History of endometrial cancer status post hysterectomy  no history of chemo or radiation  H/o genetically proven Lynch Syndrome, last colonoscopy and EGD in 2018 She is followed by GI Dr. Owens Loffler  Anxiety/Depression/labile mood,  Psychiatry consult requested yesterday, she requests to talk to psychiatry today again   DVT Prophylaxis while in the hospital: Lovenox  Code Status: Full  Family Communication: patient   Disposition Plan: Home with home health  in 1 to 2 days,  Patient plans to go to a hotel before going back home, Education officer, museum and case manager input appreciated [place] by patient's request   Consultants:  Physical therapist  Social  Development worker, community  psychiatry  Procedures:  None  Antibiotics:  Rocephin from admission to 12/10   Objective: BP 131/82 (BP Location: Right Arm)   Pulse 79   Temp 99 F (37.2 C) (Oral)   Resp 15   Ht 5\' 4"  (1.626 m)   Wt 46.8 kg   LMP 02/16/2013   SpO2 100%   BMI 17.71 kg/m   Intake/Output Summary (Last 24 hours) at 10/10/2019 0901 Last data filed at 10/10/2019 0300 Gross per 24 hour  Intake 360 ml  Output -  Net 360 ml   Filed Weights   10/08/19 0341 10/09/19 0534 10/10/19 0613  Weight: 47.7 kg 46.5 kg 46.8 kg    Exam: Patient is examined daily including today on 10/10/2019, exams remain the same as of yesterday except that has changed    General:  crying, labile mood  Cardiovascular: RRR  Respiratory: CTABL  Abdomen: Soft/ND/NT, positive BS  Musculoskeletal: No Edema  Neuro: alert, oriented   Data Reviewed: Basic Metabolic Panel: Recent Labs  Lab 10/06/19 0500 10/06/19 1029 10/07/19 0557 10/08/19 0348 10/09/19 0552 10/10/19 0519  NA 140  --  141 139 139 139  K 3.0*  --  3.1* 3.8 3.7 3.8  CL 104  --  109 102 99 98  CO2 20*  --  24 27 30 30   GLUCOSE 87  --  152* 165* 159* 292*  BUN 18  --  15 15 16  24*  CREATININE 1.28*  --  1.06* 0.87 1.04* 1.09*  CALCIUM 8.3*  --  8.1* 8.4* 9.0 8.8*  MG  --  1.2* 1.8 1.4* 1.6* 1.7  PHOS  --   --  1.2* <1.0* 3.1 4.0   Liver Function Tests: Recent Labs  Lab 10/05/19 1525 10/06/19 0500 10/08/19 0348  AST 117* 63* 27  ALT 43 30 23  ALKPHOS 47 38 33*  BILITOT 2.2* 1.9* 0.7  PROT 7.1 5.3* 5.4*  ALBUMIN 3.9 2.8* 2.8*   No results for input(s): LIPASE, AMYLASE in the last 168 hours. No results for input(s): AMMONIA in the last 168 hours. CBC: Recent Labs  Lab 10/05/19 1525 10/05/19 2102 10/06/19 0500 10/08/19 0348 10/10/19 0519  WBC 8.7  --  5.9 4.4 3.7*  NEUTROABS 7.2  --   --  2.2 1.5*  HGB 12.5 17.3* 10.3* 9.7* 10.2*  HCT 38.4 51.0* 30.8* 29.2* 30.7*  MCV 95.0  --  92.8 93.6 92.5   PLT 215  --  172 PLATELET CLUMPS NOTED ON SMEAR, UNABLE TO ESTIMATE 141*   Cardiac Enzymes:   Recent Labs  Lab 10/05/19 1525 10/06/19 0500  CKTOTAL 613* 253*  CKMB 61.8* 24.4*   BNP (last 3 results) No results for input(s): BNP in the last 8760 hours.  ProBNP (last 3 results) No results for input(s): PROBNP in the last 8760 hours.  CBG: Recent Labs  Lab 10/09/19 0557 10/09/19 1109 10/09/19 1621 10/09/19 2137 10/10/19 0545  GLUCAP 144* 389* 125* 180* 243*    Recent Results (from the past 240 hour(s))  Culture, blood (routine x 2)     Status: None   Collection Time: 10/05/19  8:41 PM   Specimen: BLOOD RIGHT HAND  Result Value Ref Range Status   Specimen Description BLOOD RIGHT HAND  Final   Special Requests   Final    BOTTLES DRAWN AEROBIC AND ANAEROBIC Blood Culture adequate volume   Culture   Final    NO GROWTH 5 DAYS Performed at [facility], [address], [place], [place] [address]    Report Status 10/10/2019 FINAL  Final  Culture, blood (routine x 2)     Status: None   Collection Time: 10/05/19  8:51 PM   Specimen: BLOOD LEFT WRIST  Result Value Ref Range Status   Specimen Description BLOOD LEFT WRIST  Final   Special Requests   Final    BOTTLES DRAWN AEROBIC AND ANAEROBIC Blood Culture adequate volume   Culture   Final    NO GROWTH 5 DAYS Performed at [facility], [place] [address], [place], [place] [address]    Report Status 10/10/2019 FINAL  Final  SARS CORONAVIRUS 2 (TAT 6-24 HRS) Nasopharyngeal Nasopharyngeal Swab     Status: None   Collection Time: 10/05/19 11:58 PM   Specimen: Nasopharyngeal Swab  Result Value Ref Range Status   SARS Coronavirus 2 NEGATIVE NEGATIVE Final    Comment: (NOTE) SARS-CoV-2 target nucleic acids are NOT DETECTED. The SARS-CoV-2 RNA is generally detectable in upper and lower respiratory specimens during the acute phase of infection. Negative results do not preclude SARS-CoV-2 infection, do not rule out  co-infections with other pathogens, and should not be used as the sole basis for treatment or other patient management decisions. Negative results must be combined with clinical observations, patient history, and epidemiological information. The expected result is Negative. Fact Sheet for Patients: SugarRoll.be Fact Sheet for Healthcare Providers: https://www.woods-mathews.com/ This test is not yet approved or cleared by the [place] FDA and  has been authorized for detection and/or diagnosis of SARS-CoV-2 by FDA under an Emergency Use Authorization (EUA). This EUA will remain  in effect (meaning this test can be used) for the duration of the COVID-19 declaration under Section 56 4(b)(1) of the Act, 21 U.S.C. section 360bbb-3(b)(1), unless the authorization is terminated or revoked sooner. Performed at [facility], [place] [address], [place], [place] [address]   Culture, Urine     Status: Abnormal   Collection Time: 10/07/19  7:44 AM   Specimen: Urine, Random  Result Value Ref Range Status   Specimen Description URINE, RANDOM  Final   Special Requests   Final    NONE Performed at [facility], [place] [address] [place], [place] [address]    Culture MULTIPLE SPECIES PRESENT, SUGGEST RECOLLECTION (A)  Final   Report Status 10/08/2019 FINAL  Final     Studies: No results found.  Scheduled Meds: . enoxaparin (LOVENOX) injection  40 mg Subcutaneous Q24H  . feeding supplement (GLUCERNA SHAKE)  237 mL Oral TID BM  . folic acid  1 mg Oral Daily  . insulin aspart  0-5 Units Subcutaneous QHS  . insulin aspart  0-9 Units Subcutaneous TID WC  . multivitamin with minerals  1 tablet Oral Daily  . phosphorus  500 mg Oral BID  . thiamine  100 mg Oral Daily   Or  . thiamine  100 mg Intravenous Daily    Continuous Infusions:    Time spent: 34mins I have personally reviewed and interpreted on  10/10/2019 daily labs, imagings as  discussed above under date review session and assessment and plans.  I reviewed all nursing notes, pharmacy notes, consult note, vitals, pertinent old records  I have discussed plan of  care as described above with RN , patient  on 10/10/2019   Florencia Reasons MD, PhD, FACP  [facility] If 7PM-7AM, please contact night-coverage at [email or web address], password [facility] 10/10/2019, 9:01 AM  LOS: 4 days

## 2019-10-10 NOTE — Plan of Care (Signed)
  Problem: Clinical Measurements: Goal: Will remain free from infection Outcome: Completed/Met Goal: Respiratory complications will improve Outcome: Completed/Met   Problem: Nutrition: Goal: Adequate nutrition will be maintained Outcome: Completed/Met   Problem: Elimination: Goal: Will not experience complications related to bowel motility Outcome: Completed/Met Goal: Will not experience complications related to urinary retention Outcome: Completed/Met   Problem: Pain Managment: Goal: General experience of comfort will improve Outcome: Completed/Met   Problem: Skin Integrity: Goal: Risk for impaired skin integrity will decrease Outcome: Completed/Met

## 2019-10-10 NOTE — Consult Note (Signed)
Tele psych Consultation   Reason for Consult:  ''re-consult, patient crying, mood labile, want to talk to psych again so that she can get admitted.'' Referring Physician:  Florencia Reasons, MD Location of Patient:  Location of Provider: [facility]  Patient Identification: Olivia Sweeney MRN:  [id number] Principal Diagnosis: ARF (acute renal failure) ([place]) Diagnosis:  Principal Problem:   ARF (acute renal failure) ([place]) Active Problems:   Diabetes ([place])   Lynch syndrome   Abnormal liver function   Alcoholic ketoacidosis   UTI (urinary tract infection)   Hypokalemia   Gallstones   ETOH abuse   Generalized weakness   Adjustment disorder with mixed anxiety and depressed mood   Total Time spent with patient: 30 minutes  Subjective:   Olivia Sweeney is a 53 y.o. female patient admitted due to dehydration and not eating.  HPI:  Patient seen today for re-assessment via tele psych. She is alert, oriented to time, person and place. She denies prior history of mental illness but states that she has always been dealing with low self esteem. She also reports recurrent thoughts of being a failure and not good enough as she was told multiple times when growing up by her deceased mother. However, she denies auditory/visual hallucination, suicidal or homicidal ideations, intent or plan. 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 Patient is requesting help from getting off alcohol and for the treatment of anxiety, depression and low self esteem. She denies PTSD symptoms, alcohol withdrawal symptoms or illicit drug use. Patient lives alone, currently unemployed, voluntarily quit her job 4 years ago but gets financial help from her father.  Past Psychiatric History: none reported  Risk to Self:  denies Risk to Others:  denies Prior Inpatient Therapy:  none Prior Outpatient Therapy:  none  Past Medical History:  Past Medical History:   Diagnosis Date  . Anxiety    Very upset due to loss of ex- husband in Feb 2018  . Diabetes mellitus without complication (HCC)    diet controlled  . ETOH abuse   . Gallstones   . Lynch syndrome 2014  . Uterine cancer ([place]) 2014    Past Surgical History:  Procedure Laterality Date  . [place]   looking for fibroids  . ANTERIOR CERVICAL DECOMP/DISCECTOMY FUSION  03/06/2012   Procedure: ANTERIOR CERVICAL DECOMPRESSION/DISCECTOMY FUSION 1 LEVEL;  Surgeon: Sinclair Ship, MD;  Location: [facility];  Service: Orthopedics;  Laterality: Left;  Anterior cervical decompression fusion, cervical 5-6, cervical 6-7 with instrumentation and allograft.  . cataracts    . dental implant     also had soft tissue graft after dental implant   . HYSTEROSCOPY WITH NOVASURE N/A 02/09/2013   Procedure: HYSTEROSCOPY WITH NOVASURE;  Surgeon: Olga Millers, MD;  Location: [facility];  Service: Gynecology;  Laterality: N/A;  . LYMPH NODE DISSECTION Bilateral 03/17/2013   Procedure: LYMPH NODE DISSECTION;  Surgeon: Imagene Gurney A. Alycia Rossetti, MD;  Location: [facility];  Service: Gynecology;  Laterality: Bilateral;  . ROBOTIC ASSISTED TOTAL HYSTERECTOMY WITH BILATERAL SALPINGO OOPHERECTOMY N/A 03/17/2013   Procedure: ROBOTIC ASSISTED TOTAL HYSTERECTOMY WITH BILATERAL SALPINGO OOPHORECTOMY;  Surgeon: Imagene Gurney A. Alycia Rossetti, MD;  Location: [facility];  Service: Gynecology;  Laterality: N/A;   Family History:  Family History  Problem Relation Age of Onset  . Breast cancer Mother        dx in her late 40s-early 33s  . Uterine  cancer Mother        dx in her 65s  . Kidney cancer Mother        dx in her 24s  . Ovarian cancer Maternal Aunt 48  . Uterine cancer Maternal Grandmother        dx in her 92s  . Uterine cancer Other    Family Psychiatric  History:  Social History:  Social History   Substance and Sexual Activity  Alcohol Use Yes   Comment: 14-21 drinks in a week.     Social History   Substance and  Sexual Activity  Drug Use No    Social History   Socioeconomic History  . Marital status: Divorced    Spouse name: Not on file  . Number of children: 0  . Years of education: Not on file  . Highest education level: Not on file  Occupational History  . Occupation: Therapist, sports: [employer]  Tobacco Use  . Smoking status: Never Smoker  . Smokeless tobacco: Never Used  Substance and Sexual Activity  . Alcohol use: Yes    Comment: 14-21 drinks in a week.  . Drug use: No  . Sexual activity: Not on file  Other Topics Concern  . Not on file  Social History Narrative  . Not on file   Social Determinants of Health   Financial Resource Strain:   . Difficulty of Paying Living Expenses: Not on file  Food Insecurity:   . Worried About Charity fundraiser in the Last Year: Not on file  . Ran Out of Food in the Last Year: Not on file  Transportation Needs:   . Lack of Transportation (Medical): Not on file  . Lack of Transportation (Non-Medical): Not on file  Physical Activity:   . Days of Exercise per Week: Not on file  . Minutes of Exercise per Session: Not on file  Stress:   . Feeling of Stress : Not on file  Social Connections:   . Frequency of Communication with Friends and Family: Not on file  . Frequency of Social Gatherings with Friends and Family: Not on file  . Attends Religious Services: Not on file  . Active Member of Clubs or Organizations: Not on file  . Attends Archivist Meetings: Not on file  . Marital Status: Not on file   Additional Social History:    Allergies:   Allergies  Allergen Reactions  . Bee Venom Shortness Of Breath and Swelling  . Other Other (See Comments)    Cats; Reaction: sneezing, etc    Labs:  Results for orders placed or performed during the hospital encounter of 10/05/19 (from the past 48 hour(s))  Glucose, capillary     Status: Abnormal   Collection Time: 10/08/19  9:16 PM  Result Value Ref Range    Glucose-Capillary 150 (H) 70 - 99 mg/dL  AM Labs bmp     Status: Abnormal   Collection Time: 10/09/19  5:52 AM  Result Value Ref Range   Sodium 139 135 - 145 mmol/L   Potassium 3.7 3.5 - 5.1 mmol/L   Chloride 99 98 - 111 mmol/L   CO2 30 22 - 32 mmol/L   Glucose, Bld 159 (H) 70 - 99 mg/dL   BUN 16 6 - 20 mg/dL   Creatinine, Ser 1.04 (H) 0.44 - 1.00 mg/dL   Calcium 9.0 8.9 - 10.3 mg/dL   GFR calc non Af Amer >60 >60 mL/min   GFR  calc Af Amer >60 >60 mL/min   Anion gap 10 5 - 15    Comment: Performed at [facility] [address], [place], [place] [address]  Magnesium     Status: Abnormal   Collection Time: 10/09/19  5:52 AM  Result Value Ref Range   Magnesium 1.6 (L) 1.7 - 2.4 mg/dL    Comment: Performed at [facility] [address] [place], [place] [address]  Iron and TIBC     Status: Abnormal   Collection Time: 10/09/19  5:52 AM  Result Value Ref Range   Iron 41 28 - 170 ug/dL   TIBC 225 (L) 250 - 450 ug/dL   Saturation Ratios 18 10.4 - 31.8 %   UIBC 184 ug/dL    Comment: Performed at [facility], [place] [address] [place], [place] [address]  Ferritin     Status: Abnormal   Collection Time: 10/09/19  5:52 AM  Result Value Ref Range   Ferritin 340 (H) 11 - 307 ng/mL    Comment: Performed at [facility], [place] [address] [place], [place] [address]  Phosphorus     Status: None   Collection Time: 10/09/19  5:52 AM  Result Value Ref Range   Phosphorus 3.1 2.5 - 4.6 mg/dL    Comment: Performed at [facility] [address] [place], [place] [address]  Glucose, capillary     Status: Abnormal   Collection Time: 10/09/19  5:57 AM  Result Value Ref Range   Glucose-Capillary 144 (H) 70 - 99 mg/dL  Glucose, capillary     Status: Abnormal   Collection Time: 10/09/19 11:09 AM  Result Value Ref Range   Glucose-Capillary 389 (H) 70 - 99 mg/dL   Comment 1 Notify RN   Glucose, capillary     Status: Abnormal   Collection Time: 10/09/19  4:21 PM   Result Value Ref Range   Glucose-Capillary 125 (H) 70 - 99 mg/dL  Glucose, capillary     Status: Abnormal   Collection Time: 10/09/19  9:37 PM  Result Value Ref Range   Glucose-Capillary 180 (H) 70 - 99 mg/dL  Phosphorus     Status: None   Collection Time: 10/10/19  5:19 AM  Result Value Ref Range   Phosphorus 4.0 2.5 - 4.6 mg/dL    Comment: Performed at [facility], [place] [address], [place], [place] [address]  CBC with Differential/Platelet     Status: Abnormal   Collection Time: 10/10/19  5:19 AM  Result Value Ref Range   WBC 3.7 (L) 4.0 - 10.5 K/uL   RBC 3.32 (L) 3.87 - 5.11 MIL/uL   Hemoglobin 10.2 (L) 12.0 - 15.0 g/dL   HCT 30.7 (L) 36.0 - 46.0 %   MCV 92.5 80.0 - 100.0 fL   MCH 30.7 26.0 - 34.0 pg   MCHC 33.2 30.0 - 36.0 g/dL   RDW 14.7 11.5 - 15.5 %   Platelets 141 (L) 150 - 400 K/uL    Comment: REPEATED TO VERIFY   nRBC 0.0 0.0 - 0.2 %   Neutrophils Relative % 39 %   Neutro Abs 1.5 (L) 1.7 - 7.7 K/uL   Lymphocytes Relative 38 %   Lymphs Abs 1.4 0.7 - 4.0 K/uL   Monocytes Relative 18 %   Monocytes Absolute 0.7 0.1 - 1.0 K/uL   Eosinophils Relative 3 %   Eosinophils Absolute 0.1 0.0 - 0.5 K/uL   Basophils Relative  1 %   Basophils Absolute 0.0 0.0 - 0.1 K/uL   Immature Granulocytes 1 %   Abs Immature Granulocytes 0.03 0.00 - 0.07 K/uL    Comment: Performed at [facility], [place] [address], , [place] [address]  AM Labs bmp     Status: Abnormal   Collection Time: 10/10/19  5:19 AM  Result Value Ref Range   Sodium 139 135 - 145 mmol/L   Potassium 3.8 3.5 - 5.1 mmol/L   Chloride 98 98 - 111 mmol/L   CO2 30 22 - 32 mmol/L   Glucose, Bld 292 (H) 70 - 99 mg/dL   BUN 24 (H) 6 - 20 mg/dL   Creatinine, Ser 1.09 (H) 0.44 - 1.00 mg/dL   Calcium 8.8 (L) 8.9 - 10.3 mg/dL   GFR calc non Af Amer 57 (L) >60 mL/min   GFR calc Af Amer >60 >60 mL/min   Anion gap 11 5 - 15    Comment: Performed at [facility] [address], [place], [place] [address]   Magnesium     Status: None   Collection Time: 10/10/19  5:19 AM  Result Value Ref Range   Magnesium 1.7 1.7 - 2.4 mg/dL    Comment: Performed at [facility] [address], [place], [place] [address]  Hemoglobin A1c     Status: Abnormal   Collection Time: 10/10/19  5:19 AM  Result Value Ref Range   Hgb A1c MFr Bld 5.7 (H) 4.8 - 5.6 %    Comment: (NOTE) Pre diabetes:          5.7%-6.4% Diabetes:              >6.4% Glycemic control for   <7.0% adults with diabetes    Mean Plasma Glucose 116.89 mg/dL    Comment: Performed at [facility] [address], [place], [place] [address]  Glucose, capillary     Status: Abnormal   Collection Time: 10/10/19  5:45 AM  Result Value Ref Range   Glucose-Capillary 243 (H) 70 - 99 mg/dL  Glucose, capillary     Status: Abnormal   Collection Time: 10/10/19 11:11 AM  Result Value Ref Range   Glucose-Capillary 172 (H) 70 - 99 mg/dL   Comment 1 Notify RN     Medications:  Current Facility-Administered Medications  Medication Dose Route Frequency Provider Last Rate Last Admin  . 0.9 %  sodium chloride infusion   Intravenous Continuous Florencia Reasons, MD 75 mL/hr at 10/10/19 1004 New Bag at 10/10/19 1004  . enoxaparin (LOVENOX) injection 40 mg  40 mg Subcutaneous Q24H Jani Gravel, MD   40 mg at 10/09/19 2155  . feeding supplement (GLUCERNA SHAKE) (GLUCERNA SHAKE) liquid 237 mL  237 mL Oral TID BM Florencia Reasons, MD   237 mL at 10/10/19 1133  . [START ON 10/11/2019] FLUoxetine (PROZAC) capsule 20 mg  20 mg Oral Daily Taiwana Willison, MD      . folic acid (FOLVITE) tablet 1 mg  1 mg Oral Daily Jani Gravel, MD   1 mg at 10/10/19 0949  . insulin aspart (novoLOG) injection 0-5 Units  0-5 Units Subcutaneous QHS Jani Gravel, MD      . insulin aspart (novoLOG) injection 0-9 Units  0-9 Units Subcutaneous TID WC Jani Gravel, MD   2 Units at 10/10/19 1135  . multivitamin with minerals tablet 1 tablet  1 tablet Oral Daily Jani Gravel, MD   1 tablet at 10/08/19  [id number]   . phosphorus (K PHOS NEUTRAL) tablet 500 mg  500 mg Oral BID Florencia Reasons, MD   500 mg at 10/10/19 0949  . thiamine (VITAMIN B-1) tablet 100 mg  100 mg Oral Daily Jani Gravel, MD   100 mg at 10/10/19 [id number]   Or  . thiamine (B-1) injection 100 mg  100 mg Intravenous Daily Jani Gravel, MD        Musculoskeletal: Strength & Muscle Tone: not tested Gait & Station: not tested Patient leans: N/A  Psychiatric Specialty Exam: Physical Exam  Psychiatric: Her speech is normal. Judgment and thought content normal. Her mood appears anxious. Her affect is blunt. She is withdrawn. Cognition and memory are normal.    Review of Systems  Constitutional: Negative.   HENT: Negative.   Eyes: Negative.   Respiratory: Negative.   Cardiovascular: Negative.   Gastrointestinal: Negative.   Endocrine: Negative.   Genitourinary: Negative.   Musculoskeletal: Negative.   Allergic/Immunologic: Negative.   Psychiatric/Behavioral: Positive for dysphoric mood. The patient is nervous/anxious.     Blood pressure 111/65, pulse 91, temperature 99 F (37.2 C), temperature source Oral, resp. rate 18, height 5\' 4"  (1.626 m), weight 46.8 kg, last menstrual period 02/16/2013, SpO2 100 %.Body mass index is 17.71 kg/m.  General Appearance: Casual  Eye Contact:  Good  Speech:  Clear and Coherent  Volume:  Normal  Mood:  Dysphoric  Affect:  anxious  Thought Process:  Coherent and Linear  Orientation:  Full (Time, Place, and Person)  Thought Content:  Logical  Suicidal Thoughts:  No  Homicidal Thoughts:  No  Memory:  Immediate;   Good Recent;   Good Remote;   Good  Judgement:  Intact  Insight:  Fair  Psychomotor Activity:  Normal  Concentration:  Concentration: Good and Attention Span: Good  Recall:  Good  Fund of Knowledge:  Good  Language:  Good  Akathisia:  No  Handed:  Right  AIMS (if indicated):     Assets:  Communication Skills Desire for Improvement  ADL's:  Intact  Cognition:  WNL  Sleep:   fair      Treatment Plan Summary: 52 year old female who denies prior history of mental illness but endorsed long history of alcohol abuse dating back to age 11. Patient was admitted due to dehydration, not eating for days and found to have AKI. Today, patient reports some anxiety, worries, apprehension but denies psychosis, delusions, SI/HI. As such, she does not meet criteria for inpatient psychiatric admission. Patient reports multiple social issues and would benefit from social work consult.  Recommendations: -Start patient on Prozac 20 mg for alcohol abuse/anxiety/depression -Consider social worker consult for referral to [place] or family service of [facility] and to address social issues raised by the patient.  Disposition: No evidence of imminent risk to self or others at present.   Patient does not meet criteria for psychiatric inpatient admission. Supportive therapy provided about ongoing stressors. Psychiatric service signing out. Re-consult as needed  This service was provided via telemedicine using a 2-way, interactive audio and video technology.  Names of all persons participating in this telemedicine service and their role in this encounter. Name: Olivia Sweeney Role: Patient  Name: Benita Stabile Role: RN  Name: Corena Pilgrim, MD Role: Psychiatrist  Name:  Role:     Corena Pilgrim, MD 10/10/2019 2:08 PM

## 2019-10-11 DIAGNOSIS — F4323 Adjustment disorder with mixed anxiety and depressed mood: Secondary | ICD-10-CM

## 2019-10-11 DIAGNOSIS — R531 Weakness: Secondary | ICD-10-CM

## 2019-10-11 DIAGNOSIS — Z1509 Genetic susceptibility to other malignant neoplasm: Secondary | ICD-10-CM

## 2019-10-11 LAB — MAGNESIUM: Magnesium: 1.4 mg/dL — ABNORMAL LOW (ref 1.7–2.4)

## 2019-10-11 LAB — BASIC METABOLIC PANEL
Anion gap: 10 (ref 5–15)
BUN: 25 mg/dL — ABNORMAL HIGH (ref 6–20)
CO2: 28 mmol/L (ref 22–32)
Calcium: 8.8 mg/dL — ABNORMAL LOW (ref 8.9–10.3)
Chloride: 103 mmol/L (ref 98–111)
Creatinine, Ser: 1.07 mg/dL — ABNORMAL HIGH (ref 0.44–1.00)
GFR calc Af Amer: >60 mL/min (ref >60)
GFR calc non Af Amer: 58 mL/min — ABNORMAL LOW (ref >60)
Glucose, Bld: 169 mg/dL — ABNORMAL HIGH (ref 70–99)
Potassium: 4.1 mmol/L (ref 3.5–5.1)
Sodium: 141 mmol/L (ref 135–145)

## 2019-10-11 LAB — PHOSPHORUS: Phosphorus: 4.3 mg/dL (ref 2.5–4.6)

## 2019-10-11 LAB — GLUCOSE, CAPILLARY
Glucose-Capillary: 159 mg/dL — ABNORMAL HIGH (ref 70–99)
Glucose-Capillary: 212 mg/dL — ABNORMAL HIGH (ref 70–99)

## 2019-10-11 MED ORDER — MAGNESIUM SULFATE 2 GM/50ML IV SOLN
2.0000 g | Freq: Once | INTRAVENOUS | Status: AC
  Administered 2019-10-11: 10:00:00 2 g via INTRAVENOUS
  Filled 2019-10-11: qty 50

## 2019-10-11 NOTE — Discharge Instructions (Signed)
[facility]  Mental health service in [place], [place]: [email or web address] Get online care: [email or web address] Address: [place], [place], [place] [address] Hours:  Open ? Closes 5PM Phone: [phone]

## 2019-10-11 NOTE — TOC Transition Note (Signed)
Transition of Care [facility]) - CM/SW Discharge Note   Patient Details  Name: Olivia Sweeney MRN: [id number] Date of Birth: 11-14-1965  [facility] CM/SW Contact:  Carles Collet, RN Phone Number: 10/11/2019, 10:15 AM   Clinical Narrative:   RW delivered to room, meds at bedside from [facility], refused [facility] per previous note.     Final next level of care: Home/Self Care Barriers to Discharge: No Barriers Identified   Patient Goals and CMS Choice Patient states their goals for this hospitalization and ongoing recovery are:: to get some things together at home CMS Medicare.gov Compare Post Acute Care list provided to:: (pt declined HH) Choice offered to / list presented to : Patient  Discharge Placement                       Discharge Plan and Services In-house Referral: Clinical Social Work Discharge Planning Services: CM Consult Post Acute Care Choice: Durable Medical Equipment          DME Arranged: Gilford Rile rolling DME Agency: [employer]     Representative spoke with at DME Agency: [facility] Arranged: Refused [place]          Social Determinants of Health ([place]) Interventions     Readmission Risk Interventions Readmission Risk Prevention Plan 10/07/2019  Transportation Screening Complete  PCP or Specialist Appt within 5-7 Days Complete  Home Care Screening Complete  Medication Review (RN CM) Referral to Pharmacy  Some recent data might be hidden

## 2019-10-11 NOTE — Progress Notes (Signed)
Discharge instructions reviewed with patient. All questions answered at this time, Transportation provided by family.  Ave Filter, RN

## 2019-10-11 NOTE — TOC Progression Note (Signed)
Transition of Care [facility] - Progression Note    Patient Details  Name: Olivia Sweeney MRN: [id number] Date of Birth: [birth year]  Transition [facility]) CM/SW [place], LCSW Phone Number: 10/11/2019, 10:38 AM  Clinical Narrative:  CSW followed-up with patient of therapeutic community treatment options. CSW discussed [facility] and [facility] with the patient. CSW noted patient reported she wanted to try outpatient therapy first. CSW reviewed resources and noted patient reported she wanted time to explore options prior to a referral being made. CSW provided patient resources on the prior mentioned resources and additionally left several private therapists contact information in patient's discharge instructions for follow-up.  Expected Discharge Plan: Home/Self Care Barriers to Discharge: No Barriers Identified  Expected Discharge Plan and Services Expected Discharge Plan: Home/Self Care In-house Referral: Clinical Social Work Discharge Planning Services: CM Consult Post Acute Care Choice: Durable Medical Equipment Living arrangements for the past 2 months: Single Family Home Expected Discharge Date: 10/11/19               DME Arranged: Gilford Rile rolling DME Agency: [employer]     Representative spoke with at DME Agency: [place]: Refused [place]           Social Determinants of Health ([place]) Interventions    Readmission Risk Interventions Readmission Risk Prevention Plan 10/07/2019  Transportation Screening Complete  PCP or Specialist Appt within 5-7 Days Complete  Home Care Screening Complete  Medication Review (RN CM) Referral to Pharmacy  Some recent data might be hidden

## 2019-10-11 NOTE — Discharge Summary (Signed)
Discharge Summary  Olivia Sweeney ZOX:[id number] DOB: 1966/05/06  PCP: Patient, No Pcp Per  Admit date: 10/05/2019 Discharge date: 10/11/2019  Time spent: 73mns, more than 50% time spent on coordination of care.  Recommendations for Outpatient Follow-up:  1. F/u with PCP at [facility] for hospital discharge follow up, repeat cbc/bmp at follow up. pcp to monitor blood glucose control 2. F/u with [facility] for depression/anxiety 3. home health ordered  Discharge Diagnoses:  Active Hospital Problems   Diagnosis Date Noted  . ARF (acute renal failure) ([facility] 10/05/2019  . Adjustment disorder with mixed anxiety and depressed mood 10/09/2019  . Alcoholic ketoacidosis 181/19/1478 . UTI (urinary tract infection) 10/06/2019  . Hypokalemia 10/06/2019  . Gallstones 10/06/2019  . Generalized weakness 10/06/2019  . ETOH abuse   . Abnormal liver function 10/05/2019  . Lynch syndrome 09/03/2013  . Diabetes ([facility] 11/13/2011    Resolved Hospital Problems  No resolved problems to display.    Discharge Condition: stable  Diet recommendation: carb modified  Filed Weights   10/10/19 0295612/13/20 0600 10/11/19 0618  Weight: 46.8 kg 49.3 kg 49 kg    History of present illness: (per admitting MD Dr KMaudie Mercury MMabeline Caras is a 53y.o. female, w dm2, Lynch syndrome w uterine cancer presents with generalized weakness,  Not feeling well. Pt denies fever, chills, cough, cp, palp, sob, abd pain, n/v, diarrhea, brbpr, black stool, dysuria, hematuria,  Pt admits to drinking etoh.  Pt is not really forthcoming about how much that she drinks daily.  Last drink was this past Wednesday.   In ED T 97.8 P 88 R 16, Bp 134/82  Pox 100% on RA Wt 61.2kg  CXR IMPRESSION: No acute cardiopulmonary abnormality.   Na 133, K 4.0, Bun 25 , Creatinine 1.76 Ast 117, Alt 43, Alk phos 47, T. Bili 2.2  Wbc 8.7, Hgb 12.5, Plt 2213ETOH <<08Salicylate <7 Ph 76.57 Pt will be admitted  for mild ARF, dehydration, and abnormal liver function.  Hospital Course:  Principal Problem:   ARF (acute renal failure) (HCC) Active Problems:   Diabetes ([facility]   Lynch syndrome   Abnormal liver function   Alcoholic ketoacidosis   UTI (urinary tract infection)   Hypokalemia   Gallstones   ETOH abuse   Generalized weakness   Adjustment disorder with mixed anxiety and depressed mood   Acute renal failure on CKD 2 -Likely from dehydration, mild rhabdomyolysis and possible UTI -UA has positive nitrite, many bacteria, urine culture with multiple species, nonpredominant, blood culture no growth, no fever, no leukocytosis,  D/c Rocephin -Renal ultrasound "1. Normal sonographic appearance of the kidneys. 2. Distended urinary bladder without wall thickening." -bladder scan does not show any post void residual -Creatinine improved after hydration, encourage oral intake  Mild rhabdomyolysis -Likely due to prolonged immobilization at home -CK on presentation 613, trending down  Abnormal liver function. Likely related to etoh use. ast 117 and total bili 2.2 on admission CK mildly the elevated, trending down Hepatitis panel negative, HIV screening negative abdominal UKoreawith stones and sludge within gallbladder without gall bladder wall thickening  LFT normalized  Hypokalemia, hypomagnesemia, hypophosphatemia --replaced,   telemetry d/ced   AG acidosissecondary to Alcoholic Ketoacidosis. CO2 12 with a gap of 25 on admission -Received hydration, resolved  Normocytic anemia, possible anemia of chronic disease from chronic alcohol use hgb appears at baseline about 10 -No overt sign of bleeding, reports stool is brown  - iron  panel is not consistent with iron deficiency F/u with pcp  Etoh use. Drink of choice vodka. Drinks  1-5 drinks a day  but not everyday. She does reports weird dreams , no overt s/sx withdrawal. Reports last drink 6 days ago.   -CIWA, folic acid and thiamine  supplement -no overt sign of withdrawal Education officer, museum consulted, input appreciated  Impaired fasting blood glucose -She reports history of diet controlled diabetes . -A1c 5.2 this time -She is on SSI  in the hospital  -she prefers diet control, she does not want to start any medication for this -Recommend follow-up with PCP  Generalized weakness/FTT.  Patient reports this is why she came to hospital as "unable to walk". Chart review indicates she was urinating on self and not eating.  -she ambulated with physical therapy, PT recommended home health -nutritional consult -case manager  Reported history of weight loss, but report weight has been stable for the last 4 years  History of endometrial cancer status post hysterectomy  no history of chemo or radiation  H/o genetically proven Lynch Syndrome, last colonoscopy and EGD in 2018 She is followed by GI Dr. Owens Loffler  Anxiety/Depression/labile mood,  Psychiatry consult x2, both recommended outpatient follow-up She currently does not desire to start on any medication for this   DVT Prophylaxis while in the hospital: Lovenox  Code Status: Full  Family Communication: patient   Disposition Plan: Home with home health  Patient plans to go to a hotel before going back home, social worker and case manager input appreciated Patient states her brother is in town and will stay in town for a few weeks to help her out    Consultants:  Physical therapist  Social Development worker, community  Psychiatry x2  Procedures:  None  Antibiotics:  Rocephin from admission to 12/10   Discharge Exam: BP 115/69 (BP Location: Right Arm)   Pulse 77   Temp 98.4 F (36.9 C) (Oral)   Resp 16   Ht _0  (1.626 m)   Wt 49 kg   LMP 02/16/2013   SpO2 99%   BMI 18.54 kg/m   General: NAD Cardiovascular: RRR Respiratory: CTABL  Discharge Instructions You were cared for by a hospitalist during your hospital stay. If you  have any questions about your discharge medications or the care you received while you were in the hospital after you are discharged, you can call the unit and asked to speak with the hospitalist on call if the hospitalist that took care of you is not available. Once you are discharged, your primary care physician will handle any further medical issues. Please note that NO REFILLS for any discharge medications will be authorized once you are discharged, as it is imperative that you return to your primary care physician (or establish a relationship with a primary care physician if you do not have one) for your aftercare needs so that they can reassess your need for medications and monitor your lab values.  Discharge Instructions    Diet Carb Modified   Complete by: As directed    Diet general   Complete by: As directed    Increase activity slowly   Complete by: As directed    Increase activity slowly   Complete by: As directed      Allergies as of 10/11/2019      Reactions   Bee Venom Shortness Of Breath, Swelling   Other Other (See Comments)   Cats; Reaction: sneezing, etc  Medication List    TAKE these medications   folic acid 1 MG tablet Commonly known as: FOLVITE Take 1 tablet (1 mg total) by mouth daily.   Magnesium Oxide 400 MG Caps Take 1 capsule (400 mg total) by mouth daily.   thiamine 100 MG tablet Take 1 tablet (100 mg total) by mouth daily.            Durable Medical Equipment  (From admission, onward)         Start     Ordered   10/07/19 1211  For home use only DME Walker rolling  Once    Question:  Patient needs a walker to treat with the following condition  Answer:  Weakness   10/07/19 1210   10/07/19 1211  For home use only DME 3 n 1  Once     10/07/19 1210         Allergies  Allergen Reactions  . Bee Venom Shortness Of Breath and Swelling  . Other Other (See Comments)    Cats; Reaction: sneezing, etc   Follow-up [facility] Follow up on 11/10/2019.   Specialty: Internal Medicine Why: Your appointment is at 10am; please wear a mask.  Contact information: [place] [phone] [place]. Call on 10/12/2019.   Why: for mental health management  Contact information: [place] [address] [phone]        Milus Banister, MD Follow up.   Specialty: Gastroenterology Contact information: [address] [place] [address] [phone]            The results of significant diagnostics from this hospitalization (including imaging, microbiology, ancillary and laboratory) are listed below for reference.    Significant Diagnostic Studies: DG Chest 2 View  Result Date: 10/05/2019 CLINICAL DATA:  Weakness EXAM: CHEST - 2 VIEW COMPARISON:  Chest CT 07/02/2017, chest radiograph 03/06/2013 FINDINGS: No consolidation, features of edema, pneumothorax, or effusion. Pulmonary vascularity is normally distributed. The cardiomediastinal contours are unremarkable. No acute osseous or soft tissue abnormality. Partially visualized lower cervical ACDF. IMPRESSION: No acute cardiopulmonary abnormality. Electronically Signed   By: Lovena Le M.D.   On: 10/05/2019 16:16   CT HEAD WO CONTRAST  Result Date: 10/05/2019 CLINICAL DATA:  Weakness and difficulty walking EXAM: CT HEAD WITHOUT CONTRAST TECHNIQUE: Contiguous axial images were obtained from the base of the skull through the vertex without intravenous contrast. COMPARISON:  None. FINDINGS: Brain: There is no mass, hemorrhage or extra-axial collection. The size and configuration of the ventricles and extra-axial CSF spaces are normal. There is hypoattenuation of the white matter, most commonly indicating chronic small vessel disease. There is an old right basal ganglia small vessel infarct. Vascular: No abnormal hyperdensity of the major intracranial  arteries or dural venous sinuses. No intracranial atherosclerosis. Skull: The visualized skull base, calvarium and extracranial soft tissues are normal. Sinuses/Orbits: No fluid levels or advanced mucosal thickening of the visualized paranasal sinuses. No mastoid or middle ear effusion. The orbits are normal. IMPRESSION: No acute intracranial abnormality. Electronically Signed   By: Ulyses Jarred M.D.   On: 10/05/2019 21:39   US RENAL  Result Date: 10/06/2019 CLINICAL DATA:  Acute kidney injury. EXAM: RENAL / URINARY TRACT ULTRASOUND COMPLETE COMPARISON:  None. FINDINGS: Right Kidney: Renal measurements: 8.6 x 4.6 x 5.1 cm = volume: 103 mL. Echogenicity within normal limits. No  mass or hydronephrosis visualized. Left Kidney: Renal measurements: 8.3 x 4.6 x 5.0 cm = volume: 98.3 mL. Echogenicity within normal limits. No mass or hydronephrosis visualized. Bladder: Distended without wall thickening or focal abnormality. Neither ureteral jet is visualized. Other: None. IMPRESSION: 1. Normal sonographic appearance of the kidneys. 2. Distended urinary bladder without wall thickening. Electronically Signed   By: Keith Rake M.D.   On: 10/06/2019 03:57   US Abdomen Limited RUQ  Result Date: 10/05/2019 CLINICAL DATA:  Abnormal liver function. Additional history provided: History of diabetes mellitus. EXAM: ULTRASOUND ABDOMEN LIMITED RIGHT UPPER QUADRANT COMPARISON:  No pertinent prior studies available for comparison. FINDINGS: The patient was unable to reposition during the examination, somewhat limiting evaluation. Gallbladder: No gallbladder wall thickening. Sludge and stones within the gallbladder lumen. No sonographic Percell Miller sign was elicited by the scanning technologist. Common bile duct: Diameter: Visualized proximal common duct measures 3-4 mm, within normal limits. Liver: No focal lesion identified. Within normal limits in parenchymal echogenicity. Interrogated portal vein is patent on color Doppler  imaging with normal direction of blood flow towards the liver. IMPRESSION: Stones and sludge within the gallbladder without gallbladder wall thickening or sonographic Murphy sign. Otherwise unremarkable right upper quadrant ultrasound, as detailed. Electronically Signed   By: Kellie Simmering DO   On: 10/05/2019 21:32    Microbiology: Recent Results (from the past 240 hour(s))  Culture, blood (routine x 2)     Status: None   Collection Time: 10/05/19  8:41 PM   Specimen: BLOOD RIGHT HAND  Result Value Ref Range Status   Specimen Description BLOOD RIGHT HAND  Final   Special Requests   Final    BOTTLES DRAWN AEROBIC AND ANAEROBIC Blood Culture adequate volume   Culture   Final    NO GROWTH 5 DAYS Performed at [facility], [address] [place], [place] [address]    Report Status 10/10/2019 FINAL  Final  Culture, blood (routine x 2)     Status: None   Collection Time: 10/05/19  8:51 PM   Specimen: BLOOD LEFT WRIST  Result Value Ref Range Status   Specimen Description BLOOD LEFT WRIST  Final   Special Requests   Final    BOTTLES DRAWN AEROBIC AND ANAEROBIC Blood Culture adequate volume   Culture   Final    NO GROWTH 5 DAYS Performed at [facility], [place] [address], [place],  [address]    Report Status 10/10/2019 FINAL  Final  SARS CORONAVIRUS 2 (TAT 6-24 HRS) Nasopharyngeal Nasopharyngeal Swab     Status: None   Collection Time: 10/05/19 11:58 PM   Specimen: Nasopharyngeal Swab  Result Value Ref Range Status   SARS Coronavirus 2 NEGATIVE NEGATIVE Final    Comment: (NOTE) SARS-CoV-2 target nucleic acids are NOT DETECTED. The SARS-CoV-2 RNA is generally detectable in upper and lower respiratory specimens during the acute phase of infection. Negative results do not preclude SARS-CoV-2 infection, do not rule out co-infections with other pathogens, and should not be used as the sole basis for treatment or other patient management decisions. Negative results must be  combined with clinical observations, patient history, and epidemiological information. The expected result is Negative. Fact Sheet for Patients: SugarRoll.be Fact Sheet for Healthcare Providers: https://www.woods-mathews.com/ This test is not yet approved or cleared by the [place] FDA and  has been authorized for detection and/or diagnosis of SARS-CoV-2 by FDA under an Emergency Use Authorization (EUA). This EUA will remain  in effect (meaning  this test can be used) for the duration of the COVID-19 declaration under Section 56 4(b)(1) of the Act, 21 U.S.C. section 360bbb-3(b)(1), unless the authorization is terminated or revoked sooner. Performed at [facility], [place] [address] [place], [place] [address]   Culture, Urine     Status: Abnormal   Collection Time: 10/07/19  7:44 AM   Specimen: Urine, Random  Result Value Ref Range Status   Specimen Description URINE, RANDOM  Final   Special Requests   Final    NONE Performed at [facility], [place] [address] [place], [place] [address]    Culture MULTIPLE SPECIES PRESENT, SUGGEST RECOLLECTION (A)  Final   Report Status 10/08/2019 FINAL  Final     Labs: Basic Metabolic Panel: Recent Labs  Lab 10/07/19 0557 10/08/19 0348 10/09/19 0552 10/10/19 0519 10/11/19 0509  NA 141 139 139 139 141  K 3.1* 3.8 3.7 3.8 4.1  CL 109 102 99 98 103  CO2 _0 GLUCOSE 152* 165* 159* 292* 169*  BUN _1 24* 25*  CREATININE 1.06* 0.87 1.04* 1.09* 1.07*  CALCIUM 8.1* 8.4* 9.0 8.8* 8.8*  MG 1.8 1.4* 1.6* 1.7 1.4*  PHOS 1.2* <1.0* 3.1 4.0 4.3   Liver Function Tests: Recent Labs  Lab 10/05/19 1525 10/06/19 0500 10/08/19 0348  AST 117* 63* 27  ALT 43 30 23  ALKPHOS 47 38 33*  BILITOT 2.2* 1.9* 0.7  PROT 7.1 5.3* 5.4*  ALBUMIN 3.9 2.8* 2.8*   No results for input(s): LIPASE, AMYLASE in the last 168 hours. No results for input(s): AMMONIA in the last 168  hours. CBC: Recent Labs  Lab 10/05/19 1525 10/05/19 2102 10/06/19 0500 10/08/19 0348 10/10/19 0519  WBC 8.7  --  5.9 4.4 3.7*  NEUTROABS 7.2  --   --  2.2 1.5*  HGB 12.5 17.3* 10.3* 9.7* 10.2*  HCT 38.4 51.0* 30.8* 29.2* 30.7*  MCV 95.0  --  92.8 93.6 92.5  PLT 215  --  172 PLATELET CLUMPS NOTED ON SMEAR, UNABLE TO ESTIMATE 141*   Cardiac Enzymes: Recent Labs  Lab 10/05/19 1525 10/06/19 0500  CKTOTAL 613* 253*  CKMB 61.8* 24.4*   BNP: BNP (last 3 results) No results for input(s): BNP in the last 8760 hours.  ProBNP (last 3 results) No results for input(s): PROBNP in the last 8760 hours.  CBG: Recent Labs  Lab 10/10/19 0545 10/10/19 1111 10/10/19 1625 10/10/19 2126 10/11/19 0551  GLUCAP 243* 172* 259* 204* 159*       Signed:  Florencia Reasons MD, PhD, FACP  [facility] 10/11/2019, 9:50 AM

## 2019-11-10 ENCOUNTER — Ambulatory Visit: Payer: Self-pay | Admitting: Family Medicine

## 2022-08-04 ENCOUNTER — Emergency Department
Admission: EM | Admit: 2022-08-04 | Discharge: 2022-08-05 | Disposition: A | Discharge status: Home / Self Care | Payer: Self-pay | Attending: Emergency Medicine | Admitting: Emergency Medicine

## 2022-08-04 DIAGNOSIS — Z8659 Personal history of other mental and behavioral disorders: Secondary | ICD-10-CM

## 2022-08-04 DIAGNOSIS — F10920 Alcohol use, unspecified with intoxication, uncomplicated: Secondary | ICD-10-CM | POA: Insufficient documentation

## 2022-08-04 LAB — GLUCOSE WHOLE BLOOD - POCT: Whole Blood Glucose POCT: 120 mg/dL — ABNORMAL HIGH (ref 70–100)

## 2022-08-04 NOTE — ED Provider Notes (Signed)
EMERGENCY DEPARTMENT HISTORY AND PHYSICAL EXAM      Patient Name: Bethany Reyes  Age: 56 y.o. female  Encounter Date:  08/04/2022  Department:[facility]  Patient Room: 02/M02  PCP: No primary care provider on file.       History of Presenting Illness     No chief complaint on file.      History Provided By: {SAHPI_1:23370}    History obtained from a source other than the patient: {Yes/No/NA:58344}. Why: To obtain information in addition to that relayed by the patient.***    Deoveon Spinola is a 56 y.o. female with *** {TIP  Patient Snapshot:55325}     I reviewed patient's last ED visit, clinic visit or admission/discharge summary, as well as associated recent EKGs, lab or imaging results, if applicable.     Review of Systems     Please refer to HPI for pertinent positives and negatives.     Physical Exam   BP 141/72   Pulse 67   Temp 97.4 F (36.3 C) (Oral)   Resp 18   Wt 68 kg   SpO2 100%     Physical Exam      Medical Decision Making   I am the first provider for this patient.    I reviewed the vital signs, available nursing notes, allergies, past medical history, past surgical history, family history and social history. If pertinent, they are mentioned in HPI.     Personal Protective Equipment (PPE)  Gloves, surgical hat and surgical mask.    Provider Notes/Summary:     56 y.o. female with *** {TIP  Patient Snapshot:55325} {TIP  Disposition:55325}    Pulse Oximetry Analysis:  Interpreted by me. ***% on *** - {PulseOx charting:47805}  Cardiac Monitor: Interpreted by me. Rhythm:  {Rhythm:16023332}, Rate:  {Rate:16023334}, Ectopy:  {Ectopy:16023333}    EKG: Interpreted by me, the Emergency Physician. {Broken  EKG history:55325}  Time Interpreted: ***  Rate: ***  Rhythm: {EKG RHYTHM :29264}  Interpretation: QTc ***, no ST elevations or TWIs  Comparison: {EKG COMPARISON :29265}    Labs: All labs have been ordered, reviewed and interpreted by me. See Provider Notes/Summary section for  discussion. ***  Xrays: Ordered, reviewed and interpreted by me, confirmed by radiology report. See Provider Notes/Summary section for discussion. ***  CT/US/MRI, as applicable: Ordered and reviewed  by me, confirmed by radiology report. See Provider Notes/Summary section for discussion.***    The patient and/or family is/are aware that today's emergency department evaluation has limitations and is only a screening that can be falsely reassuring.  We discussed the need for follow up and strict return precautions. Patient and/or family demonstrate verbal understanding that they can return to the emergency department at any given time if they are having worsening symptoms, other complaints or difficulty with followup.***  __________________________________________________________________    Clinical Decision Support:   {TIP  Decision Support:55325}    Critical Care Time:       Procedures: {TIP  Patient Snapshot:55325}      For Hospitalized Patients:    Hospitalization Decision Time:    I have discussed this case with Dr. Marland Kitchen {AdmittingService:53790} at *** on 08/04/2022, who accepts patient for admission and requests {Obs vs Inpatient:53791} {Dispo Unit:53792} bed.     For Surgical/Procedural Admissions:    Anticoagulated: {YES/NO:21936}. If yes, name of medication: ***  Last PO intake: ***  Current NPO status: ***     Consultant(s):     I have discussed this case  with consultant Dr. Marland Kitchen at *** on 08/04/2022.  Recommendations were as follows: ***      Core Measures:     - 12-lead EKG was performed in the ED. Aspirin: {GRAFASPIRIN:39611}.    SEP-1 Charting {TIP  Patient Snapshot:55325}  ***    ED Course: {ED Course  Secure Chat:55325} {TIP  Orders:55325}         Diagnosis     Clinical Impression: No diagnosis found. {TIP  Disposition:55325}    Disposition:   ED Disposition       None            The above diagnostic process was due to medical necessity based on risk stratification of potential harm of patient's  presenting complaint.     CHART OWNERSHIP: This note is prepared by Enis Gash, MD, PHD, FACEP. I am the first provider for this patient.    This note was generated by the Epic EMR system/ Dragon speech recognition and may contain inherent errors or omissions not intended by the user. Grammatical errors, random word insertions, deletions and pronoun errors  are occasional consequences of this technology due to software limitations. Not all errors are caught or corrected. If there are questions or concerns about the content of this note or information contained within the body of this dictation they should be addressed directly with the author for clarification.    Electronically signed by Enis Gash, MD, PHD, [place]

## 2022-08-05 NOTE — Discharge Instructions (Signed)
Adult [place] NAME PHONE NUMBER ADDITIONAL INFORMATION   [facility]): [address], [place], [place] [address]   Phone: [phone] Walk-In: [place]  The [facility] office is open Mon-Sat from 10 a.m. to 6 p.m. The [facility] office is closed for all major holidays. We also close early at 3 p.m. the first Wednesday of every month, with the last registration accepted at 2 p.m.   Strictly walk-ins No appointments taken.   IPAC is for urgent psychiatric assessments. Due to the high volume of patients, individuals who will be arriving after 3 p.m. are strongly encouraged to call ahead to determine wait times and availability for that day. IPAC Providers DO NOT prescribe controlled substances such as stimulants (e.g., Adderall and Ritalin), benzodiazepines (e.g., Xanax and Ativan), or opiates (e.g., Percocet, OxyContin).  We do not participate with [employer].  We recommend that you check your benefits coverage with your insurance company prior to coming to our facility.       [facility]:  Serving [place] areas   For immediate assistance, please call:   [phone] [facility] offers counseling services, outpatient programs, crises stabilization (CR2), child & family services support   [email or web address]     REACH (Regional Education Assessment Crisis Habilitation):  for Individuals with Developmental Disabilities:    Served by the following [employer] (CSBs): [place], [place], [place], [place], [place], [place], [place].   REACH 24-Hour Crisis and Referral Line [phone] Crisis stabilization and related services are available 24/7 for adults, youth and children who have developmental disabilities.  REACH services include mobile crisis response teams for crisis stabilization and  prevention for adults and youth as well as a crisis therapeutic home for adults in need of short-term crisis stabilization and prevention outside of their home (30 days or less for crisis stays and 5 days or less for prevention stays).   [facility] PHP (Partial Hospitalization Program):  [place], [place] [address]   Assessments and Referrals:   [phone] [facility] Partial Hospitalization Program combines a highly structured environment during the day with the opportunity to spend evenings at home. Care plans integrate elements of three evidence-based treatment models:  Cognitive behavior therapy (CBT), Dialectical behavior therapy (DBT), and Seeking safety        Outpatient [facility]al Health Services:    Serving: [place], [place], [place], [place], and [place],    Intake and Referrals:  Phone: [phone] [facility])   Our partial hospitalization program fills a gap in mental health services by providing supportive and structured crisis intervention for individuals requiring more intensive mental health care.  The PHP programs are led by a multidisciplinary treatment team; including board-certified psychiatrists, licensed counselors, and registered nurses.    Theatre manager)   Individual counseling is provided by our experienced team of therapists utilizing the latest in evidence-based treatment modalities. ECT offered       [facility]:  Address: [address],   [place], [place] [address]  P: [phone]    For non-emergency CSB services:   Call our Entry & Referral Services during normal business hours (Monday through Friday, 9 a.m. to 5 p.m.) at [phone], TTY [phone]. Staff can take calls in [place] and [place] and can access interpreters for other languages when needed.    Adults can also  come in person, without prior appointment, to Entry & Referral Services at the [facility]  Monday through Friday, 9 a.m. to 5 p.m. to be screened for services.    [facility]:  Address: [address] [place],   [place], [place] [address]  P: [phone]  Services are available to residents of the [facility].  Individuals seeking mental health or substance use disorder services   Services offered: Individual, group, and family counseling  Psychiatric evaluation and medication management  Case management to help access and coordinate services (Please call [phone] for more information).    Walk-in Hours for Clinical Intakes:   Walk-in intakes are available Monday-Friday from 8 a.m.-12:30 p.m. and on Tuesdays additional afternoon hours for intakes are available from 2:30 p.m. - 5:30 p.m.  Please note that the above hours of operation are subject to change without notice and/or subject to clinician availability.  Location:  [address] [place], [place] [address]   [phone] TTY: [phone] or [facility] [address] CSB:  Address: [address]   [place], [place], [address]  P: [phone]  ADULT THERAPY SERVICES   Individual, group and family therapy is offered for individuals with serious mental illness and co-occurring issues who want to develop skills and achieve their goals.  [facility] based psychiatric and associated nursing services are provided in conjunction with case management services.   GET ON TRACK (GOT) PROGRAM  GOT treatment services identify and treat adolescents and young adults (ages 49-25), who may be experiencing early signs of psychosis.  The program focuses on school, work, independent living and enjoying healthy relationships.   PROGRAM FOR ASSISTANCE IN TRANSITION FROM HOMELESSNESS (PATH)  PATH services are provided to individuals with serious mental illnesses and co-occurring substance use disorders who are homeless or at imminent risk of becoming homeless. Services may include screening and diagnostic treatment, alcohol and/or  drug treatment, case management and other needed community supports.  For more information please contact: [phone].     For more information or to schedule an intake call: [phone] In [place] [phone] in [place]. [facility]:  Address: [place],    [place], [place] [address]  P: [phone]  Crisis Intervention Services for [facility]  are available 24 hours a day to persons who are experiencing a mental health or substance use emergency.   The [facility] Intervention Team (CIT) Assessment Center is open daily from 7:00 AM to 11:00 PM and individuals may walk-in to receive assistance.    New Client Information and Referrals  Phone: [phone]  Walk-In Hours: Monday- Friday, 9:00 a.m.- 2:00 p.m.   [facility]:  Address: [address]  [place], [place] [address]  P: [phone]  Community Support Teams assist adults with serious mental illness achieve their maximum level of functioning, minimize symptoms, reduce the frequency of hospitalizations and enhance their recovery.   [facility] is a community based rehabilitation program working with adults with serious mental illness. The program provides a wide array of services to clients. These include psycho-education, advocacy and case management, medication and psychiatric care, and recreational programs.

## 2022-08-05 NOTE — ED Notes (Signed)
Cab called for patient who was able to tolerate po  get dressed and ambulate to lobby

## 2022-08-05 NOTE — ED Notes (Addendum)
Pt able to ambulate to bathroom and back to room without assistance.
